# Patient Record
Sex: Female | Born: 1935 | Race: Black or African American | Hispanic: No | Smoking: Never smoker
Health system: Southern US, Community
[De-identification: ages and names within clinical notes are randomized; demographics above are authoritative.]

## PROBLEM LIST (undated history)

## (undated) DIAGNOSIS — N186 End stage renal disease: Secondary | ICD-10-CM

## (undated) DIAGNOSIS — D631 Anemia in chronic kidney disease: Secondary | ICD-10-CM

## (undated) DIAGNOSIS — I10 Essential (primary) hypertension: Secondary | ICD-10-CM

## (undated) DIAGNOSIS — I701 Atherosclerosis of renal artery: Secondary | ICD-10-CM

## (undated) DIAGNOSIS — E559 Vitamin D deficiency, unspecified: Secondary | ICD-10-CM

## (undated) DIAGNOSIS — M109 Gout, unspecified: Secondary | ICD-10-CM

## (undated) DIAGNOSIS — L97509 Non-pressure chronic ulcer of other part of unspecified foot with unspecified severity: Secondary | ICD-10-CM

## (undated) DIAGNOSIS — I639 Cerebral infarction, unspecified: Secondary | ICD-10-CM

## (undated) DIAGNOSIS — I6529 Occlusion and stenosis of unspecified carotid artery: Secondary | ICD-10-CM

## (undated) DIAGNOSIS — D509 Iron deficiency anemia, unspecified: Secondary | ICD-10-CM

## (undated) DIAGNOSIS — N189 Chronic kidney disease, unspecified: Secondary | ICD-10-CM

## (undated) DIAGNOSIS — E039 Hypothyroidism, unspecified: Secondary | ICD-10-CM

## (undated) DIAGNOSIS — I7090 Unspecified atherosclerosis: Secondary | ICD-10-CM

## (undated) DIAGNOSIS — I509 Heart failure, unspecified: Secondary | ICD-10-CM

## (undated) HISTORY — PX: ABDOMINAL HYSTERECTOMY: SHX81

## (undated) HISTORY — PX: DIALYSIS FISTULA CREATION: SHX611

---

## 2003-11-05 ENCOUNTER — Other Ambulatory Visit: Payer: Self-pay

## 2004-12-12 ENCOUNTER — Inpatient Hospital Stay: Payer: Self-pay | Admitting: Internal Medicine

## 2004-12-12 ENCOUNTER — Other Ambulatory Visit: Payer: Self-pay

## 2008-08-27 ENCOUNTER — Inpatient Hospital Stay: Payer: Self-pay | Admitting: Internal Medicine

## 2010-03-22 ENCOUNTER — Inpatient Hospital Stay: Payer: Self-pay | Admitting: Internal Medicine

## 2010-04-04 ENCOUNTER — Encounter: Payer: Self-pay | Admitting: Family Medicine

## 2010-04-12 ENCOUNTER — Encounter: Payer: Self-pay | Admitting: Family Medicine

## 2010-05-11 ENCOUNTER — Encounter: Payer: Self-pay | Admitting: Family Medicine

## 2010-10-14 ENCOUNTER — Emergency Department: Payer: Self-pay | Admitting: Emergency Medicine

## 2011-01-02 ENCOUNTER — Ambulatory Visit: Payer: Self-pay | Admitting: Vascular Surgery

## 2011-09-12 ENCOUNTER — Ambulatory Visit: Payer: Self-pay | Admitting: Vascular Surgery

## 2011-09-12 LAB — CBC
HCT: 36 % (ref 35.0–47.0)
HGB: 11.6 g/dL — ABNORMAL LOW (ref 12.0–16.0)
MCH: 25.9 pg — ABNORMAL LOW (ref 26.0–34.0)
MCHC: 32.1 g/dL (ref 32.0–36.0)
MCV: 81 fL (ref 80–100)
RBC: 4.47 10*6/uL (ref 3.80–5.20)
RDW: 13.9 % (ref 11.5–14.5)

## 2011-09-12 LAB — BASIC METABOLIC PANEL
Chloride: 109 mmol/L — ABNORMAL HIGH (ref 98–107)
Co2: 21 mmol/L (ref 21–32)
Creatinine: 2.77 mg/dL — ABNORMAL HIGH (ref 0.60–1.30)
Potassium: 4.4 mmol/L (ref 3.5–5.1)
Sodium: 139 mmol/L (ref 136–145)

## 2011-09-19 ENCOUNTER — Ambulatory Visit: Payer: Self-pay | Admitting: Vascular Surgery

## 2012-05-03 ENCOUNTER — Inpatient Hospital Stay: Payer: Self-pay | Admitting: Internal Medicine

## 2012-05-03 LAB — COMPREHENSIVE METABOLIC PANEL
Albumin: 3.8 g/dL (ref 3.4–5.0)
Bilirubin,Total: 0.8 mg/dL (ref 0.2–1.0)
Calcium, Total: 9.2 mg/dL (ref 8.5–10.1)
Co2: 20 mmol/L — ABNORMAL LOW (ref 21–32)
EGFR (Non-African Amer.): 17 — ABNORMAL LOW
Glucose: 94 mg/dL (ref 65–99)
Osmolality: 285 (ref 275–301)
Potassium: 4.3 mmol/L (ref 3.5–5.1)
SGOT(AST): 18 U/L (ref 15–37)
SGPT (ALT): 14 U/L (ref 12–78)
Total Protein: 8.9 g/dL — ABNORMAL HIGH (ref 6.4–8.2)

## 2012-05-03 LAB — CK TOTAL AND CKMB (NOT AT ARMC)
CK-MB: 0.9 ng/mL (ref 0.5–3.6)
CK-MB: 0.9 ng/mL (ref 0.5–3.6)

## 2012-05-03 LAB — CBC WITH DIFFERENTIAL/PLATELET
Basophil #: 0.1 10*3/uL (ref 0.0–0.1)
Basophil %: 1.1 %
Eosinophil #: 0.3 10*3/uL (ref 0.0–0.7)
Eosinophil %: 3.7 %
HCT: 41.9 % (ref 35.0–47.0)
MCH: 24.2 pg — ABNORMAL LOW (ref 26.0–34.0)
MCHC: 32.1 g/dL (ref 32.0–36.0)
Monocyte #: 0.7 x10 3/mm (ref 0.2–0.9)
Monocyte %: 7.9 %
Neutrophil #: 5.4 10*3/uL (ref 1.4–6.5)
Neutrophil %: 64 %
RBC: 5.54 10*6/uL — ABNORMAL HIGH (ref 3.80–5.20)
RDW: 17.6 % — ABNORMAL HIGH (ref 11.5–14.5)

## 2012-05-03 LAB — TROPONIN I: Troponin-I: 0.02 ng/mL

## 2012-05-03 LAB — TSH: Thyroid Stimulating Horm: 2.49 u[IU]/mL

## 2012-05-04 LAB — CBC WITH DIFFERENTIAL/PLATELET
Basophil %: 1.3 %
Lymphocyte #: 1.3 10*3/uL (ref 1.0–3.6)
Lymphocyte %: 20.8 %
MCHC: 32.5 g/dL (ref 32.0–36.0)
MCV: 75 fL — ABNORMAL LOW (ref 80–100)
Monocyte #: 0.8 x10 3/mm (ref 0.2–0.9)
Monocyte %: 13.1 %
Neutrophil %: 60.6 %
RBC: 4.83 10*6/uL (ref 3.80–5.20)
RDW: 17.1 % — ABNORMAL HIGH (ref 11.5–14.5)

## 2012-05-04 LAB — BASIC METABOLIC PANEL
Calcium, Total: 8.8 mg/dL (ref 8.5–10.1)
Chloride: 112 mmol/L — ABNORMAL HIGH (ref 98–107)
Creatinine: 2.7 mg/dL — ABNORMAL HIGH (ref 0.60–1.30)
EGFR (African American): 18 — ABNORMAL LOW
EGFR (Non-African Amer.): 15 — ABNORMAL LOW
Glucose: 86 mg/dL (ref 65–99)

## 2012-05-04 LAB — CK TOTAL AND CKMB (NOT AT ARMC)
CK, Total: 34 U/L (ref 21–215)
CK-MB: 0.7 ng/mL (ref 0.5–3.6)

## 2012-05-04 LAB — LIPID PANEL
Cholesterol: 127 mg/dL (ref 0–200)
VLDL Cholesterol, Calc: 13 mg/dL (ref 5–40)

## 2012-05-05 LAB — BASIC METABOLIC PANEL
Anion Gap: 8 (ref 7–16)
Calcium, Total: 8.3 mg/dL — ABNORMAL LOW (ref 8.5–10.1)
Creatinine: 3.48 mg/dL — ABNORMAL HIGH (ref 0.60–1.30)
EGFR (African American): 13 — ABNORMAL LOW
EGFR (Non-African Amer.): 11 — ABNORMAL LOW
Potassium: 4.1 mmol/L (ref 3.5–5.1)
Sodium: 140 mmol/L (ref 136–145)

## 2012-05-05 LAB — URINALYSIS, COMPLETE
Bacteria: NONE SEEN
Bilirubin,UR: NEGATIVE
Glucose,UR: NEGATIVE mg/dL (ref 0–75)
Leukocyte Esterase: NEGATIVE
Nitrite: NEGATIVE
Ph: 5 (ref 4.5–8.0)
Protein: 30
Specific Gravity: 1.01 (ref 1.003–1.030)
Squamous Epithelial: 1
WBC UR: 1 /HPF (ref 0–5)

## 2012-05-05 LAB — TROPONIN I
Troponin-I: 0.04 ng/mL
Troponin-I: 0.03 ng/mL
Troponin-I: 0.03 ng/mL

## 2012-05-05 LAB — PHOSPHORUS: Phosphorus: 4 mg/dL (ref 2.5–4.9)

## 2012-05-06 LAB — BASIC METABOLIC PANEL
Co2: 22 mmol/L (ref 21–32)
EGFR (African American): 13 — ABNORMAL LOW
EGFR (Non-African Amer.): 11 — ABNORMAL LOW
Glucose: 99 mg/dL (ref 65–99)
Osmolality: 292 (ref 275–301)
Potassium: 3.9 mmol/L (ref 3.5–5.1)
Sodium: 140 mmol/L (ref 136–145)

## 2012-05-06 LAB — RENAL FUNCTION PANEL
Albumin: 2.9 g/dL — ABNORMAL LOW (ref 3.4–5.0)
Anion Gap: 9 (ref 7–16)
BUN: 47 mg/dL — ABNORMAL HIGH (ref 7–18)
Chloride: 106 mmol/L (ref 98–107)
Co2: 24 mmol/L (ref 21–32)
EGFR (African American): 11 — ABNORMAL LOW
Glucose: 78 mg/dL (ref 65–99)
Osmolality: 289 (ref 275–301)
Phosphorus: 4 mg/dL (ref 2.5–4.9)
Potassium: 4.1 mmol/L (ref 3.5–5.1)

## 2012-05-06 LAB — HEMOGLOBIN: HGB: 11 g/dL — ABNORMAL LOW (ref 12.0–16.0)

## 2012-05-06 LAB — PLATELET COUNT: Platelet: 216 10*3/uL (ref 150–440)

## 2013-08-27 ENCOUNTER — Inpatient Hospital Stay: Payer: Self-pay | Admitting: Specialist

## 2013-08-27 LAB — BASIC METABOLIC PANEL
Anion Gap: 12 (ref 7–16)
BUN: 51 mg/dL — AB (ref 7–18)
Calcium, Total: 9 mg/dL (ref 8.5–10.1)
Chloride: 108 mmol/L — ABNORMAL HIGH (ref 98–107)
Co2: 19 mmol/L — ABNORMAL LOW (ref 21–32)
Creatinine: 3.23 mg/dL — ABNORMAL HIGH (ref 0.60–1.30)
EGFR (African American): 14 — ABNORMAL LOW
EGFR (Non-African Amer.): 12 — ABNORMAL LOW
GLUCOSE: 81 mg/dL (ref 65–99)
OSMOLALITY: 290 (ref 275–301)
Potassium: 4 mmol/L (ref 3.5–5.1)
SODIUM: 139 mmol/L (ref 136–145)

## 2013-08-27 LAB — TSH: Thyroid Stimulating Horm: 1.98 u[IU]/mL

## 2013-08-27 LAB — CK TOTAL AND CKMB (NOT AT ARMC)
CK, TOTAL: 117 U/L
CK, Total: 68 U/L
CK, Total: 34 U/L
CK-MB: 1.7 ng/mL (ref 0.5–3.6)
CK-MB: 2.3 ng/mL (ref 0.5–3.6)
CK-MB: 2.1 ng/mL (ref 0.5–3.6)

## 2013-08-27 LAB — CBC WITH DIFFERENTIAL/PLATELET
BASOS ABS: 0.1 10*3/uL (ref 0.0–0.1)
Basophil %: 1.3 %
Eosinophil #: 0.1 10*3/uL (ref 0.0–0.7)
Eosinophil %: 1.2 %
HCT: 34.6 % — AB (ref 35.0–47.0)
HGB: 11 g/dL — ABNORMAL LOW (ref 12.0–16.0)
LYMPHS PCT: 13.1 %
Lymphocyte #: 1 10*3/uL (ref 1.0–3.6)
MCH: 26 pg (ref 26.0–34.0)
MCHC: 31.9 g/dL — AB (ref 32.0–36.0)
MCV: 82 fL (ref 80–100)
MONO ABS: 1 x10 3/mm — AB (ref 0.2–0.9)
Monocyte %: 12.6 %
Neutrophil #: 5.7 10*3/uL (ref 1.4–6.5)
Neutrophil %: 71.8 %
Platelet: 151 10*3/uL (ref 150–440)
RBC: 4.24 10*6/uL (ref 3.80–5.20)
RDW: 15.9 % — ABNORMAL HIGH (ref 11.5–14.5)
WBC: 8 10*3/uL (ref 3.6–11.0)

## 2013-08-27 LAB — APTT
Activated PTT: 27.7 secs (ref 23.6–35.9)
Activated PTT: 100.7 secs — ABNORMAL HIGH (ref 23.6–35.9)

## 2013-08-27 LAB — MAGNESIUM: Magnesium: 2.1 mg/dL

## 2013-08-27 LAB — TROPONIN I
TROPONIN-I: 0.08 ng/mL — AB
Troponin-I: 0.12 ng/mL — ABNORMAL HIGH
Troponin-I: 0.14 ng/mL — ABNORMAL HIGH

## 2013-08-28 LAB — BASIC METABOLIC PANEL
ANION GAP: 10 (ref 7–16)
BUN: 53 mg/dL — ABNORMAL HIGH (ref 7–18)
CHLORIDE: 108 mmol/L — AB (ref 98–107)
CO2: 22 mmol/L (ref 21–32)
CREATININE: 3.37 mg/dL — AB (ref 0.60–1.30)
Calcium, Total: 8.6 mg/dL (ref 8.5–10.1)
EGFR (Non-African Amer.): 11 — ABNORMAL LOW
GFR CALC AF AMER: 13 — AB
GLUCOSE: 79 mg/dL (ref 65–99)
OSMOLALITY: 293 (ref 275–301)
Potassium: 3.7 mmol/L (ref 3.5–5.1)
Sodium: 140 mmol/L (ref 136–145)

## 2013-08-28 LAB — CBC WITH DIFFERENTIAL/PLATELET
Basophil #: 0.1 10*3/uL (ref 0.0–0.1)
Basophil %: 1.7 %
EOS PCT: 3 %
Eosinophil #: 0.2 10*3/uL (ref 0.0–0.7)
HCT: 29.5 % — AB (ref 35.0–47.0)
HGB: 9.5 g/dL — AB (ref 12.0–16.0)
LYMPHS ABS: 1.4 10*3/uL (ref 1.0–3.6)
LYMPHS PCT: 19.3 %
MCH: 25.7 pg — AB (ref 26.0–34.0)
MCHC: 32.1 g/dL (ref 32.0–36.0)
MCV: 80 fL (ref 80–100)
MONO ABS: 1.2 x10 3/mm — AB (ref 0.2–0.9)
Monocyte %: 15.9 %
NEUTROS ABS: 4.4 10*3/uL (ref 1.4–6.5)
NEUTROS PCT: 60.1 %
PLATELETS: 148 10*3/uL — AB (ref 150–440)
RBC: 3.68 10*6/uL — AB (ref 3.80–5.20)
RDW: 15.8 % — AB (ref 11.5–14.5)
WBC: 7.3 10*3/uL (ref 3.6–11.0)

## 2013-08-28 LAB — URINALYSIS, COMPLETE
BILIRUBIN, UR: NEGATIVE
Blood: NEGATIVE
Glucose,UR: NEGATIVE mg/dL (ref 0–75)
Ketone: NEGATIVE
Leukocyte Esterase: NEGATIVE
Nitrite: NEGATIVE
Ph: 5 (ref 4.5–8.0)
Protein: NEGATIVE
RBC,UR: 6 /HPF (ref 0–5)
SPECIFIC GRAVITY: 1.005 (ref 1.003–1.030)
Squamous Epithelial: <1
WBC UR: 2 /HPF (ref 0–5)

## 2013-08-28 LAB — LIPID PANEL
CHOLESTEROL: 80 mg/dL (ref 0–200)
HDL Cholesterol: 26 mg/dL — ABNORMAL LOW (ref 40–60)
Ldl Cholesterol, Calc: 46 mg/dL (ref 0–100)
Triglycerides: 39 mg/dL (ref 0–200)
VLDL Cholesterol, Calc: 8 mg/dL (ref 5–40)

## 2013-08-28 LAB — APTT: Activated PTT: 105.1 secs — ABNORMAL HIGH (ref 23.6–35.9)

## 2013-08-29 LAB — APTT: Activated PTT: 135.8 secs — ABNORMAL HIGH (ref 23.6–35.9)

## 2013-11-25 ENCOUNTER — Inpatient Hospital Stay: Payer: Self-pay | Admitting: Internal Medicine

## 2013-11-25 LAB — TROPONIN I
TROPONIN-I: 0.54 ng/mL — AB
TROPONIN-I: 0.68 ng/mL — AB
Troponin-I: 0.5 ng/mL — ABNORMAL HIGH

## 2013-11-25 LAB — CK-MB
CK-MB: 2.8 ng/mL (ref 0.5–3.6)
CK-MB: 3.3 ng/mL (ref 0.5–3.6)

## 2013-11-25 LAB — COMPREHENSIVE METABOLIC PANEL
ANION GAP: 11 (ref 7–16)
AST: 32 U/L (ref 15–37)
Albumin: 3 g/dL — ABNORMAL LOW (ref 3.4–5.0)
Alkaline Phosphatase: 104 U/L
BILIRUBIN TOTAL: 1.9 mg/dL — AB (ref 0.2–1.0)
BUN: 52 mg/dL — AB (ref 7–18)
CHLORIDE: 108 mmol/L — AB (ref 98–107)
CO2: 18 mmol/L — AB (ref 21–32)
CREATININE: 3.24 mg/dL — AB (ref 0.60–1.30)
Calcium, Total: 8.6 mg/dL (ref 8.5–10.1)
EGFR (Non-African Amer.): 12 — ABNORMAL LOW
GFR CALC AF AMER: 14 — AB
GLUCOSE: 106 mg/dL — AB (ref 65–99)
Osmolality: 288 (ref 275–301)
Potassium: 4.9 mmol/L (ref 3.5–5.1)
SGPT (ALT): 17 U/L
Sodium: 137 mmol/L (ref 136–145)
Total Protein: 7.6 g/dL (ref 6.4–8.2)

## 2013-11-25 LAB — URINALYSIS, COMPLETE
Bilirubin,UR: NEGATIVE
Blood: NEGATIVE
GLUCOSE, UR: NEGATIVE mg/dL (ref 0–75)
Hyaline Cast: 6
Ketone: NEGATIVE
Nitrite: NEGATIVE
Ph: 5 (ref 4.5–8.0)
Protein: 30
Specific Gravity: 1.01 (ref 1.003–1.030)
Squamous Epithelial: 5
WBC UR: 11 /HPF (ref 0–5)

## 2013-11-25 LAB — CBC
HCT: 35.9 % (ref 35.0–47.0)
HGB: 10.9 g/dL — ABNORMAL LOW (ref 12.0–16.0)
MCH: 21.9 pg — ABNORMAL LOW (ref 26.0–34.0)
MCHC: 30.5 g/dL — AB (ref 32.0–36.0)
MCV: 72 fL — AB (ref 80–100)
PLATELETS: 163 10*3/uL (ref 150–440)
RBC: 4.99 10*6/uL (ref 3.80–5.20)
RDW: 22.9 % — ABNORMAL HIGH (ref 11.5–14.5)
WBC: 6.4 10*3/uL (ref 3.6–11.0)

## 2013-11-25 LAB — CK TOTAL AND CKMB (NOT AT ARMC)
CK, Total: 66 U/L
CK-MB: 2.5 ng/mL (ref 0.5–3.6)

## 2013-11-25 LAB — PRO B NATRIURETIC PEPTIDE: B-Type Natriuretic Peptide: 12601 pg/mL — ABNORMAL HIGH (ref 0–450)

## 2013-11-26 LAB — CBC WITH DIFFERENTIAL/PLATELET
BASOS PCT: 1.4 %
Basophil #: 0.1 10*3/uL (ref 0.0–0.1)
Eosinophil #: 0.2 10*3/uL (ref 0.0–0.7)
Eosinophil %: 2.7 %
HCT: 33.8 % — ABNORMAL LOW (ref 35.0–47.0)
HGB: 10.6 g/dL — AB (ref 12.0–16.0)
LYMPHS ABS: 1.1 10*3/uL (ref 1.0–3.6)
LYMPHS PCT: 17.4 %
MCH: 22.2 pg — AB (ref 26.0–34.0)
MCHC: 31.3 g/dL — AB (ref 32.0–36.0)
MCV: 71 fL — ABNORMAL LOW (ref 80–100)
Monocyte #: 1.1 x10 3/mm — ABNORMAL HIGH (ref 0.2–0.9)
Monocyte %: 16.3 %
NEUTROS ABS: 4.1 10*3/uL (ref 1.4–6.5)
NEUTROS PCT: 62.2 %
Platelet: 98 10*3/uL — ABNORMAL LOW (ref 150–440)
RBC: 4.75 10*6/uL (ref 3.80–5.20)
RDW: 22.7 % — AB (ref 11.5–14.5)
WBC: 6.6 10*3/uL (ref 3.6–11.0)

## 2013-11-26 LAB — BASIC METABOLIC PANEL
Anion Gap: 13 (ref 7–16)
BUN: 47 mg/dL — ABNORMAL HIGH (ref 7–18)
CALCIUM: 8.8 mg/dL (ref 8.5–10.1)
Chloride: 106 mmol/L (ref 98–107)
Co2: 21 mmol/L (ref 21–32)
Creatinine: 3.27 mg/dL — ABNORMAL HIGH (ref 0.60–1.30)
EGFR (Non-African Amer.): 12 — ABNORMAL LOW
GFR CALC AF AMER: 14 — AB
GLUCOSE: 73 mg/dL (ref 65–99)
OSMOLALITY: 290 (ref 275–301)
Potassium: 4.1 mmol/L (ref 3.5–5.1)
Sodium: 140 mmol/L (ref 136–145)

## 2013-11-26 LAB — MAGNESIUM: Magnesium: 2 mg/dL

## 2013-11-27 LAB — CBC WITH DIFFERENTIAL/PLATELET
BASOS PCT: 3.2 %
Basophil #: 0.2 10*3/uL — ABNORMAL HIGH (ref 0.0–0.1)
EOS ABS: 0.2 10*3/uL (ref 0.0–0.7)
EOS PCT: 2.8 %
HCT: 31.4 % — AB (ref 35.0–47.0)
HGB: 9.8 g/dL — AB (ref 12.0–16.0)
LYMPHS ABS: 0.6 10*3/uL — AB (ref 1.0–3.6)
LYMPHS PCT: 9.3 %
MCH: 22.1 pg — ABNORMAL LOW (ref 26.0–34.0)
MCHC: 31 g/dL — AB (ref 32.0–36.0)
MCV: 71 fL — ABNORMAL LOW (ref 80–100)
Monocyte #: 0.9 x10 3/mm (ref 0.2–0.9)
Monocyte %: 14.5 %
NEUTROS ABS: 4.3 10*3/uL (ref 1.4–6.5)
NEUTROS PCT: 70.2 %
PLATELETS: 58 10*3/uL — AB (ref 150–440)
RBC: 4.41 10*6/uL (ref 3.80–5.20)
RDW: 23.3 % — ABNORMAL HIGH (ref 11.5–14.5)
WBC: 6.2 10*3/uL (ref 3.6–11.0)

## 2013-11-28 LAB — BASIC METABOLIC PANEL
Anion Gap: 12 (ref 7–16)
BUN: 40 mg/dL — AB (ref 7–18)
Calcium, Total: 8.2 mg/dL — ABNORMAL LOW (ref 8.5–10.1)
Chloride: 104 mmol/L (ref 98–107)
Co2: 24 mmol/L (ref 21–32)
Creatinine: 3.04 mg/dL — ABNORMAL HIGH (ref 0.60–1.30)
GFR CALC AF AMER: 15 — AB
GFR CALC NON AF AMER: 13 — AB
Glucose: 80 mg/dL (ref 65–99)
OSMOLALITY: 288 (ref 275–301)
Potassium: 4.2 mmol/L (ref 3.5–5.1)
Sodium: 140 mmol/L (ref 136–145)

## 2013-11-28 LAB — CBC WITH DIFFERENTIAL/PLATELET
BASOS ABS: 0.1 10*3/uL (ref 0.0–0.1)
BASOS PCT: 1.4 %
EOS ABS: 0.2 10*3/uL (ref 0.0–0.7)
Eosinophil %: 2.6 %
HCT: 31.8 % — AB (ref 35.0–47.0)
HGB: 10 g/dL — ABNORMAL LOW (ref 12.0–16.0)
LYMPHS ABS: 1 10*3/uL (ref 1.0–3.6)
LYMPHS PCT: 16.4 %
MCH: 22.4 pg — ABNORMAL LOW (ref 26.0–34.0)
MCHC: 31.3 g/dL — AB (ref 32.0–36.0)
MCV: 72 fL — ABNORMAL LOW (ref 80–100)
Monocyte #: 1.1 x10 3/mm — ABNORMAL HIGH (ref 0.2–0.9)
Monocyte %: 18.9 %
NEUTROS ABS: 3.5 10*3/uL (ref 1.4–6.5)
Neutrophil %: 60.7 %
Platelet: 66 10*3/uL — ABNORMAL LOW (ref 150–440)
RBC: 4.45 10*6/uL (ref 3.80–5.20)
RDW: 23 % — AB (ref 11.5–14.5)
WBC: 5.8 10*3/uL (ref 3.6–11.0)

## 2013-11-28 LAB — PHOSPHORUS: Phosphorus: 2.4 mg/dL — ABNORMAL LOW (ref 2.5–4.9)

## 2013-11-28 LAB — MAGNESIUM: Magnesium: 1.9 mg/dL

## 2014-03-06 ENCOUNTER — Emergency Department: Payer: Self-pay | Admitting: Emergency Medicine

## 2014-05-04 ENCOUNTER — Ambulatory Visit: Payer: Self-pay | Admitting: Vascular Surgery

## 2014-07-02 NOTE — Consult Note (Signed)
PATIENT NAME:  Yvonne Roy, POULLARD MR#:  [id number] DATE OF BIRTH:  04-28-1922  DATE OF CONSULTATION:  05/04/2012  REFERRING PHYSICIAN:  Shreyang H. Allena Katz, MD CONSULTING PHYSICIAN:  Norwood Quezada D. Juliann Pares, MD  PRIMARY CARE PHYSICIAN:  [facility].    INDICATION: Shortness of breath, elevated troponin, weakness and fatigue.   HISTORY OF PRESENT ILLNESS: The patient is an 81 year old African American female with a history of hyperlipidemia, hypertension, who has a history of peripheral vascular disease, chronic renal insufficiency. She has had an upper extremity AV a shunt placed for dialysis, came to the Emergency Room because of complaints of left arm discomfort, jumping on its own and the patient  had some numbness. She states that she had an AV shunt with intermittent episodes of numbness in the past.  She also reports that she got short of breath. When she came to the Emergency Room, she was found to be slightly hypoxic. Chest x-ray was suggestive of congestive heart failure. The patient was admitted for further evaluation and care. She is having some dyspnea on exertion, some leg swelling. She has been using 2 pillows at night, vague chest discomfort. No syncope. No weakness or fatigue.   REVIEW OF SYSTEMS:  No blackout spells or syncope. No nausea, vomiting. No fever, no chills, no sweats, no weight loss or no weight gain. No hemoptysis, no hematemesis. No bright red blood per rectum.   PAST MEDICAL HISTORY: Chronic renal insufficiency, hypertension, diabetes, peripheral vascular disease, coronary artery disease.   PAST SURGICAL HISTORY:  Carotid endarterectomy, cardiac catheterization, left arm fistula, history of cerebrovascular accident.   SOCIAL HISTORY: No smoking or alcohol consumption. Lives with her daughter, retired.   FAMILY HISTORY: Hypertension, coronary artery disease and hypercholesterolemia.   ALLERGIES: CLONIDINE CAUSES BRADYCARDIA.   MEDICATIONS:  Amlodipine 10 mg a day, aspirin  81 mg a day, carvedilol 25 mg twice a day, > 10 mg a day, Plavix 75 mg a day, Colcrys 0.6 mg daily, Lasix 40 mg a day, hydralazine 50 mg 4 times a day, Imdur 30 mg a day, multivitamin once a day,  Renvela 800 mg t.i.d., sodium bicarbonate 650 twice a day, Synthroid 100 mcg daily.   PHYSICAL EXAMINATION: VITAL SIGNS:  Blood pressure160/80, pulse 75, respiratory rate 20, afebrile.  HEENT: Normocephalic, atraumatic. Pupils equal and reactive to light.  NECK: Supple. No significant jugular venous distention or adenopathy.  LUNGS: Bilateral crackles in both lungs. Mild rhonchi, adequate air movement.  HEART: Regular rate and rhythm. Systolic ejection murmur left sternal border, 2/6. Positive S4.  ABDOMEN: Benign EXTREMITIES:  1 to 2+ edema.  NEUROLOGIC: Intact.  SKIN: Normal.   LABORATORY, DIAGNOSTIC, AND RADIOLOGICAL DATA: BNP 21,000 glucose 94, BUN 24, creatinine 2.51, sodium 141, potassium 4.3, chloride 111, CO2 20, calcium 9.2. LFTs are negative. CPK 35. MB 0.9. Troponin 0.2. White count 8.5, hemoglobin 13, platelet count 287.   Chest x-ray shows mild congestive heart failure.   ASSESSMENT: 1.  Heart failure.  2.  Peripheral vascular disease. 3.  Murmur.  4.  Chronic renal insufficiency. 5.  Left arm fistula.  6.  Hypothyroidism.  7.  Edema. 8.  History of cerebrovascular accident in the past. 9.  Elevated BNP.   PLAN: I agree with admit. Rule out for myocardial infarction. Follow-up cardiac enzymes, follow up troponins, follow up EKG. Recommend further evaluation of heart failure. Would recommend echocardiogram, continue diuresis. Continue blood pressure control. Follow-up lipid management. Diabetes management unnecessary. Continue aggressive therapy for heart failure and  left arm numbness.  Continue treatment for gout. Shortness of breath may be related to heart failure. We will evaluate for cardiomyopathy as well. Try to diurese as necessary for heart failure which showed up o  chest  x-ray. We will treat the patient medically for now.      ____________________________ [name] D. [name], MD ddc:cc D: 05/04/2012 20:40:21 ET T: 05/04/2012 21:39:05 ET JOB#: [id number]  cc: Hollie Wojahn D. [name], MD, <Dictator> Alwyn PeaWAYNE D Jametta Moorehead MD ELECTRONICALLY SIGNED 05/22/2012 10:51

## 2014-07-02 NOTE — H&P (Signed)
DATE OF BIRTH:  04/03/1922  DATE OF ADMISSION:  05/03/2012  PRIMARY CARE PROVIDER:   [facility]   EMERGENCY DEPARTMENT REFERRING DOCTOR:   Dr. Cyril Loosen   REASON FOR ADMISSION: Shortness of breath.   HISTORY OF PRESENT ILLNESS: The patient is an 82 year old African-American female with history of hyperlipidemia, hypertension, who has a history of peripheral vascular disease, who has chronic renal failure, has had an upper extremity AV graft for future dialysis, who came to the ED with main complaint of having her left arm, she stated, that was jumping up on its own. It also had some numbness. The patient reports that since she has had the AV graft, she has had intermittent episodes of the arm going numb. She also reported that she has been short of breath. In the ED, patient was noted to be hypoxic and was noted to have a chest x-ray  suggestive of congestive heart failure, so I was asked to admit the patient with congestive heart failure. The patient reports that she has been having dyspnea on exertion. Also has noticed swelling in the lower extremity, but she only uses one pillow at nighttime. In terms of the left arm symptoms, she no longer has any weakness in the arm. She reports that she was awake when this happened, so there was no seizure-type activity. She reports that it was just for a minute she was unable to control the arm, and it was moving up and down. She otherwise denies any chest pains, palpitations. Denies any fevers or chills. No cough. Denies any abdominal pain, nausea, vomiting or diarrhea. Denies any urinary frequency, urgency or hesitancy.   PAST MEDICAL HISTORY: 1.  History of chronic renal failure, which is progressive.  2.  Hypertension.  3.  Diabetes.  4.  Peripheral vascular disease.  5.  History of carotid endarterectomy on the left. 6.  History of coronary artery disease. She previously had a cardiac catheterization done. According to records, she has a cath; however,  patient reports that there was no stent placed. According to an echocardiogram report that showed that she had 2-vessel significant disease, it fails to quantify how much the obstruction was.  7.  Status post left arm AV fistula placement.  8.  History of CVA, according to the patient.   9.  Hyperlipidemia.  10.  Hypothyroidism.   HOME MEDICATIONS:  Amlodipine 10 daily. Aspirin 81, 1 tab p.o. daily. Carvedilol 25, 1 tab p.o. b.i.d. Cetirizine 10, 1 tab p.o. daily. Plavix 75 p.o. daily. Colcrys 0.6 daily. Lasix 40, 1 tab p.o. b.i.d. Hydralazine 50, 1 tab 4 times per day. Isosorbide mononitrate 30 daily.  Multivitamin daily. Renvela carbonate 800 mg 1 tab p.o. t.i.d. Sodium bicarb 650, 1 tab p.o. b.i.d. Synthroid 100 mcg daily.   ALLERGIES:  She is allergic to CLONIDINE, which causes severe bradycardia.   FAMILY HISTORY:  Positive for hypertension, coronary artery disease and high cholesterol.   SOCIAL HISTORY:  Does not smoke, does not drink. Lives with her daughter.   REVIEW OF SYSTEMS:   GENERAL:  Denies any fevers. Complains of some mild weakness. No weight  loss, no weight gain.  EYES: No blurred or double vision. No redness. No inflammation.  EARS, NOSE, THROAT:  No tinnitus. No ear pain. No difficulty swallowing.  RESPIRATORY:  Denies any cough, wheezing, hemoptysis. No COPD, no TB.  CARDIOVASCULAR: Denies any chest pain. Complains of  dyspnea on exertion. Complains of edema. Denies any arrhythmia.  GASTROINTESTINAL:  No nausea,  vomiting, diarrhea. No abdominal pain. No hematemesis. No melena. No changes in bowel habits.  GENITOURINARY:  Denies any dysuria, hematuria, renal colic or frequency.  ENDOCRINE:  Denies any polyuria, nocturia or thyroid problems.  HEMATOLOGIC AND LYMPHATIC:  Denies any major bruisability or bleeding.  SKIN:  No acne. No rash. No changes in mole, hair or skin.  MUSCULOSKELETAL:  Denies any pain in neck, back, or shoulder.  NEUROLOGIC:  No numbness. No CVA,  no TIA.  PSYCHIATRIC:  No anxiety. No insomnia. No ADD.   PHYSICAL EXAMINATION: VITAL SIGNS:  Temperature 97.8, pulse 78, respirations 20, blood pressure 160/81, O2 is 93%.  GENERAL: The patient is an elderly African-American female. She is mildly  tachypneic, with mild accessory muscle usage.    HEENT: Pupils equal, round, reactive to light and accommodation. There is no conjunctival pallor. No scleral icterus. Nasal exam shows no drainage or ulceration. Oropharynx is clear, without any exudate.  NECK:  No thyromegaly. No carotid bruits.  CARDIOVASCULAR:  She has a systolic murmur at the left sternal border. No murmurs, rubs, clicks or gallops.  LUNGS:  Bilateral crackles throughout both lungs.  ABDOMEN:  Soft, nontender, nondistended. Positive bowel sounds x 4.  EXTREMITIES:  She has 1+ edema.  SKIN:  No rash.  LYMPHATICS:  No lymph nodes palpable.  VASCULAR:  Good DP, PT pulses.  PSYCHIATRIC:  Not anxious or depressed.  NEUROLOGIC: Awake, alert, oriented x 3. No focal deficits.   EVALUATIONS IN THE EMERGENCY DEPARTMENT:  BNP of 21,370. Glucose 94, BUN 24, creatinine 2.51, sodium 141, potassium 4.3, chloride 111, CO2 is 20, calcium 9.2. LFTs: Total protein 8.9, the rest of the LFTs are normal. CPK 35, CKMB 0.9. Troponin 0.02. WBC 8.5, hemoglobin 13.4, platelet count 287. Chest x-ray shows findings consistent with bilateral infiltrates and CHF. .   ASSESSMENT AND PLAN: The patient is an 81 year old African-American female with history of progressive renal failure, who presents with abnormal movement of the arm lasting a minute. In the Emergency Department, was noted to have dyspnea. Findings consistent with acute congestive heart failure.   1.  Acute congestive heart failure, type unknown. At this time, will treat her with IV Lasix. Will get an echocardiogram of the heart. The patient has murmur on examination, possible aortic stenosis. Will get an echocardiogram, get a Cardiology  evaluation.   2.  Chronic renal failure, stage IV. With diuresis, her renal function may worsen. Will ask Nephrology to evaluate the patient.   3.  Abnormal movement of the left arm. Unlikely seizure. Will check a CT scan of the head because of the numbness, with history of cerebrovascular accident in the past. At this time, will place her on aspirin.     4.  Hypothyroidism: Will continue Synthroid, check a TSH.   5.  CODE STATUS: I discussed with the patient. She wishes to be DO NOT RESUSCITATE. No cardiopulmonary resuscitation. No intubation. Marland Kitchen.   NOTE:  45 minutes spent.    ____________________________ Lacie ScottsShreyang H. Allena KatzPatel, MD shp:mr D: 05/03/2012 18:28:18 ET T: 05/03/2012 22:59:57 ET JOB#: [id number]  cc: Joscelin Fray H. Allena KatzPatel, MD, <Dictator> Charise CarwinSHREYANG H Carra Brindley MD ELECTRONICALLY SIGNED 05/05/2012 12:29

## 2014-07-02 NOTE — Discharge Summary (Signed)
PATIENT NAME:  Yvonne Roy, BERRES MR#:  [id number] DATE OF BIRTH:  1922/07/27  DATE OF ADMISSION:  05/03/2012 DATE OF DISCHARGE:  05/06/2012  ADMITTING DIAGNOSES:  1.  Acute congestive heart failure.  2.  Chronic kidney disease stage IV.  3.  Abnormal movement of left arm.   DISCHARGE DIAGNOSES: 1.  Suspected transient ischemic attack with left upper extremity numbness as well as weakness, resolved.  2.  Acute on chronic renal failure with diuresis with residual creatinine on 05/06/2012 of 3.47 and estimated GFR of 13 for African American lady.  3.  Congestive heart failure, left heart, acute on chronic, diastolic.  Echocardiogram this admission showing normal ejection fraction of 55%, mild mitral regurgitation as well as moderate tricuspid regurgitation and elevated right ventricular pressures.  4.  Hypertension.  5.  Heart murmur.  6.  Atrial fibrillation, back in sinus rhythm now.  7.  History of hyperthyroidism. 8.  Chronic kidney disease. 9.  Diabetes mellitus.  10.  Peripheral vascular disease.  11.  Left CEA. 12.  Coronary artery disease.  13.  Hyperlipidemia.  14.  Left AV fistula placement.   The patient was advised to weigh herself every day and call physician if she gains more than 2 pounds in one day or more than 5 pounds in one week.   DISCHARGE CONDITION:  Fair.   DISCHARGE MEDICATIONS:  The patient is to resume her outpatient medications which are:  1.  Renvela carbonite 800 mg by mouth 3 times daily.  2.  Synthroid 100 mcg by mouth daily.  3.  Multivitamins 1 daily.  4.  Clopidogrel 75 mg by mouth daily.  5.  Furosemide 40 mg by mouth twice daily.  6.  Isosorbide mononitrate 30 mg by mouth daily.  7.  Sodium bicarbonate 650 mg by mouth twice daily.  8.  Cetirizine 10 mg by mouth daily.  9.  Aspirin 81 mg by mouth daily.  10.  Carvedilol 12.5 mg by mouth twice daily.  11.  Amlodipine 5 mg by mouth daily.  12.  Hydralazine 25 mg by mouth 4 times daily.   Blood  pressure medications are being decreased in doses.  DIET:  2 gram salt, low fat, low cholesterol, carbohydrate-controlled diet, renal diet, regular consistency.   ACTIVITY LIMITATIONS:  As tolerated.   REFERRAL:  To home health, physical therapy as well as R.N.    FOLLOW-UP APPOINTMENT:  With Bobbye Riggs, RN in two days after discharge as well as Dr. Thedore Mins two days after discharge.   CONSULTANTS:  Nephrologist, Dr. Mosetta Pigeon, cardiologist, Dr. Juliann Pares, care management.   RADIOLOGIC STUDIES:  Chest x-ray, PA and lateral, 05/03/2012, showed findings consistent with congestive heart failure with pulmonary interstitial and alveolar edema.  Also, pleural effusions were noted secondary to pneumonia, could be obscured by densities present according to radiologist.  The patient's CT scan of head without contrast done on 05/03/2012 revealed no acute intracranial abnormality.  There were noted to be age-related changes present, and there is stable small lacunar infarction in the basilar ganglia.  Carotid ultrasound done on 05/04/2012, was significant for possible plaques noted in both carotid systems, no evidence of significant carotid stenosis however noted.  No dramatic change since January 2012.  Ultrasound of kidneys, bilateral, 05/04/2012, revealed kidneys which were small.  The cortical echo texture was increased consistent with medical renal disease.  No evidence of hydronephrosis was noted.  Nonsuspicious solid masses were demonstrated.  There were cysts present in both kidneys.  Echocardiogram 05/04/2012 revealed left ventricle grossly normal size.  No thrombus.  Left ventricular systolic function normal.  Ejection fraction equal or more than 55%, normal left ventricular wall thickness noted.  Right ventricle was moderately dilated.  Right ventricular systolic function was mildly reduced.  The left ventricular systolic pressure was elevated at 40 to 50 mmHg.  Small pericardial effusion was noted.     HOSPITAL COURSE:  The patient is an 81 year old African American female with past medical history significant for history of CKD who presented to the hospital on 05/03/2012 with complaints of shortness of breath.  Please refer to Dr. Eliane DecreePatel's admission note on 05/03/2012.  Apparently, patient came in because of left arm numbness as well as weakness.  She also reported her arm jumping and she was not sure if she was able to change that voluntarily.  She was noted to be short of breath and was admitted for congestive heart failure.  Her vitals on arrival to the hospital showed temperature 97.8, pulse was 78, respiratory rate was 20, blood pressure 160/81, saturation was 93% on oxygen therapy.  Physical exam revealed 1+ lower extremity edema and a systolic murmur on the left sternal border on exam.  The patient's lab data done on the day of admission 05/03/2012, revealed markedly elevated beta type natriuretic peptide of 21,370.  The patient's BUN and creatinine were 24 and 2.51, bicarbonate level was low at 20.  Estimated GFR for African American would be 19, and non-African American would be 17.  The patient's liver enzymes revealed total protein elevated to 8.9.  Cardiac enzymes x 3 were within normal limits.  TSH was normal at 2.49.  CBC, white blood cell count 8.5, hemoglobin was 13.4 and platelet count was 287 with a low MCV of 76.  Absolute neutrophil count was normal at 5.4.  Urinalysis was unremarkable.  The patient's EKG showed normal sinus rhythm at 87 beats per minute, premature atrial complexes, left axis deviation, no acute ST-T changes were noted.  Chest x-ray was remarkable for CHF.  The patient was admitted to the hospital for further evaluation.  She was initiated on diuretic therapy due to congestive heart failure and shortness of breath and mild hypoxia.  With this therapy she improved significantly, however with diuretic therapy she was noted to have worsening of acute on chronic renal failure.   On 05/06/2012, she was noted to have creatinine of 3.88.  It was felt that patient's acute on chronic renal failure was very likely related to diuretics as well as hypotension which patient was noted with advanced blood pressure medications.  For this reason, patient's blood pressure medications were cut down and the patient was given low rate IV fluids, after which patient's kidney function somewhat improved to 3.47.  It was felt that patient is stable to be discharged home.  She is to follow up with Dr. Cherylann RatelLateef or Dr. Thedore MinsSingh as outpatient for management of her kidney disease.  Meanwhile, in regards to her acute on chronic diastolic CHF, the patient was feeling quite okay and her oxygenation remained stable.  On day of discharge, 05/06/2012, the patient's oxygen saturation was 93% to 94% on room air at rest.    In regards to left upper extremity weakness, initially the patient had some weakness on arrival to the hospital, however no voluntary or involuntary movements were noted whatsoever.  As mentioned above, it was concerning for possible stroke and the patient underwent further evaluation for possible stroke with carotid ultrasound as  well as echocardiogram.  The patient's symptoms however resolved in the next 24 hours after admission to the hospital.  It was felt that patient very likely had TIA, however we could not completely rule out possible seizure episode resulting in some weakness of left upper extremity such as Pott's paralysis.  We felt however that it was less likely as patient had no other symptoms and she did not have any loss of consciousness.  However, if she has any more recurrences, I feel that she would need to be worked up for seizure disorder as well.  Meanwhile, she is to continue aspirin as well as Plavix.  We checked patient's lipid panel while she was in the hospital and noted that her LDL was 87.  The patient's triglycerides were 64, HDL was 27.  We advised patient to continue low fat,  low cholesterol diet and follow up with her primary care physician for further recommendations.  We will also change patient's omeprazole to Protonix due to interaction with Plavix to decrease the risks of stroke.    Next, in regards to heart murmur, we did echocardiogram while she was in the hospital and the patient was noted to have mild MR as well as moderate TR.  We felt that patient's valvular insufficiency very likely is the source of her heart murmur.  The patient's echocardiogram revealed normal ejection fraction and elevated right ventricular pressures.  The patient is to continue diuretics.   Next, the patient had an episode of atrial fibrillation while in the hospital, however she was back in sinus rhythm by the day of discharge.  The patient is to continue beta-blockers such as carvedilol.  It is recommended to follow patient's blood pressure readings and make decisions about advancement according to original doses as soon as possible to control her heart rate.  By the day of discharge, patient's vital signs are otherwise stable and temperature was 97.8, pulse was 64, respiration rate was 20, blood pressure 111/59.  Saturation was 93% to 94% on room air at rest.    Next, for history of CKD, as mentioned above, the patient had an episode of acute on chronic renal failure due to diuresis as well as slight hypotension while she was in the hospital.  Now her kidney function seem to be somewhat improved on some IV fluid administration.  We decreased patient's blood pressure medications with hopes that her kidney function will improve further along and she is to have her kidney function rechecked in her primary care physician or nephrologist office in the next few days after discharge.  Neurologist to continue her usual outpatient medications.   For diabetes mellitus, patient is to continue low fat, low cholesterol, as well as diabetic diet.  The patient needs to be considered for outpatient  cholesterol medications.  We did not initiate any cholesterol medications at this time yet due to the unclear reasons.    For history of peripheral vascular disease, as mentioned above rechecked patient's carotid ultrasound and we do not see any hemodynamically significant stenosis.  The patient is being discharged in stable condition with above-mentioned medications and follow-up.   TIME SPENT:  40 minutes.     ____________________________ Katharina Caper, MD rv:ea D: 05/06/2012 18:38:48 ET T: 05/06/2012 23:01:49 ET JOB#: [id number]  cc: Katharina Caper, MD, <Dictator> Bobbye Riggs, RN     Naleigha Raimondi Winona Legato MD ELECTRONICALLY SIGNED 06/03/2012 15:12

## 2014-07-03 NOTE — Discharge Summary (Signed)
PATIENT NAME:  Yvonne Roy, Yvonne Roy MR#:  [id number] DATE OF BIRTH:  27-Oct-1922  DATE OF ADMISSION:  08/27/2013 DATE OF DISCHARGE:  08/29/2013  For a detailed note, please take a look at the history and physical done on admission by Dr. Adrian SaranSital Mody.   DIAGNOSES AT DISCHARGE: As follows: Elevated troponin, likely in the setting of poor renal clearance; history of chronic kidney disease, stage V; history of gout; hypertension; history of congestive heart failure.   DIET: The patient is being discharged on a low-sodium, low-fat diet.   ACTIVITY: As tolerated.   FOLLOWUP: With [facility] Yvon[facility] at [facility] in the next 1 to 2 weeks.    DISCHARGE MEDICATIONS: Synthroid 100 mcg daily, Plavix 75 mg daily, Imdur 30 mg daily, sodium bicarbonate 650 mg b.i.d., cetirizine 10 mg daily, aspirin 81 mg daily, Lasix 80 mg b.i.d., hydralazine 100 mg t.i.d., ranitidine 150 mg b.i.d., HCTZ 25 mg daily, losartan 100 mg daily, allopurinol 100 mg daily, ibuprofen 200 mg q.6 hours as needed, amlodipine 5 mg daily, Colace with Senokot 2 tabs at bedtime as needed.   CONSULTANTS DURING THE HOSPITAL COURSE: Dr. Lady GaryFath from cardiology.   PERTINENT STUDIES DONE DURING THE HOSPITAL COURSE: Are as follows: A 2-dimensional echocardiogram done showing ejection fraction of 40% to 45%, moderately decreased global LV systolic function, moderately dilated left atrium, moderate pleural effusion, moderate mitral valve regurgitation.   BRIEF HOSPITAL COURSE: This is an 81 year old female with medical problems as mentioned above, presented to the hospital with chest pain and some fluttering of her heart.  1.  Chest pain and fluttering of her heart with a mildly elevated troponin: Initially, this was thought to be secondary to a non-ST-elevation MI, but the patient's symptoms had clinically resolved shortly after admission. The patient was seen by cardiology, who did not think that the patient had any evidence of acute coronary syndrome, but  this was likely elevated troponin likely in the setting of poor renal clearance given her chronic kidney disease. The patient was maintained on her aspirin, Plavix, Imdur, and losartan and is currently being discharged on that as she is asymptomatic and hemodynamically stable.  2.  Chronic kidney disease, stage V: The patient's creatinine is currently at baseline. The patient follows up with Dr. Thedore MinsSingh as an outpatient. 3.  Hypertension: The patient's blood pressure remained fairly stable. She will continue on Norvasc, hydralazine, HCTZ, and losartan.  4.  Gout: The patient was maintained on her allopurinol. She will resume that. She is on colchicine t.i.d., which is actually a very high dosage for somebody with chronic kidney disease stage V; therefore, this was discontinued. This is to be further addressed as an outpatient.  5.  Hypothyroidism: The patient was maintained on her Synthroid and she will resume that.   CODE STATUS: The patient is a full code.   DISPOSITION: She is being discharged home.   TIME SPENT: 40 minutes.   ____________________________ Rolly PancakeVivek J. Cherlynn KaiserSainani, MD vjs:jcm D: 08/29/2013 15:25:30 ET T: 08/29/2013 19:18:51 ET JOB#: [id number]  cc: Rolly PancakeVivek J. Cherlynn KaiserSainani, MD, <Dictator> Yvonne RiggsSylvia Mand, FNP [place] SirenVIVEK J SAINANI MD ELECTRONICALLY SIGNED 09/08/2013 20:30

## 2014-07-03 NOTE — H&P (Signed)
PATIENT NAME:  Yvonne Roy, Yvonne Roy MR#:  [id number] DATE OF BIRTH:  05/20/1922  DATE OF ADMISSION:  08/27/2013  PRIMARY CARE PHYSICIAN: [facility]  PRIMARY CARDIOLOGIST: Dr. Gwen Pounds  CHIEF COMPLAINT: Just feel sluggish for the past few days and palpitations.   HISTORY OF PRESENT ILLNESS: A very pleasant 81 year old female with a history of CVA, hypothyroidism, hypertension, myocardial infraction, and paroxysmal atrial fibrillation who presents with the above complaint. Over the past few days, the patient says that she has been feeling very sluggish weak and when she lies on her left side she feels palpitations. She denies any sort of chest pain. She also has had decreased appetite for several days as well, although today in the ER she is hungry. No other symptoms are noted.   REVIEW OF SYSTEMS: CONSTITUTIONAL: No fever. Positive fatigue, weakness.  EYES: No blurred or double vision.  ENT: Positive mild hearing loss. No snoring, postnasal drip. RESPIRATORY: No cough, wheezing, hemoptysis or COPD. CARDIOVASCULAR: No chest pain, orthopnea, edema. Positive paroxysmal atrial fibrillation. No dyspnea on exertion. She has palpitations. GASTROINTESTINAL: No nausea, vomiting, diarrhea, abdominal pain, melena or ulcers. GENITOURINARY: No dysuria or hematuria. ENDOCRINE: No polyuria, polydipsia. HEME AND LYMPH: No easy bruising or bleeding.  SKIN: No rash or lesions.  MUSCULOSKELETAL: She has limited activity due to age.  NEUROLOGIC: Positive history of CVA. No dementia or dysarthria. PSYCHIATRIC: No history of anxiety or depression.  PAST MEDICAL HISTORY: 1.  Hyperlipidemia. 2.  Hypothyroidism. 3.  Chronic kidney disease, stage IV, approaching endstage renal disease. 4.  Hypertension. 5.  Peripheral vascular disease.  6.  CVA. 7.  Coronary artery disease status post cardiac catheterization in 2005 revealing normal left main system, LAD insignificant, left circumflex insignificant. EF at that  time was 60%. 8.  Gout.  FAMILY HISTORY: Positive for diabetes, CAD.  SOCIAL HISTORY: The patient used to smoke a pack to 2 packs for more than 10 years, quit over 30 years ago. No alcohol or IV drug use.   ALLERGIES: No known allergies.   PAST SURGICAL HISTORY:  1.  Hysterectomy.  2.  Right and left cataract surgery over 20+ years ago.  3.  CEA.   MEDICATIONS: 1.  Synthroid 100 mcg daily.  2.  Sodium bicarbonate 650 one tablet b.i.d.  3.  Ranitidine 150 b.i.d.  4.  Losartan 100 mg daily.  5.  Imdur 30 mg daily.  6.  Ibuprofen 200 mg q. 6 hours p.r.n.  7.  HCTZ 25 mg daily. 8.  Hydralazine 100 mg t.i.d.  9.  Lasix 80 mg b.i.d.  10.  Docusate 2 tablets daily.  11.  Colchicine 0.6 mg t.i.d.  12.  Plavix 75 mg daily. 13.  Cetirizine 10 mg daily.  14.  Aspirin 81 mg daily.  15.  Norvasc 5 mg daily.  16.  Allopurinol 100 mg daily.   PHYSICAL EXAMINATION: VITAL SIGNS: Temperature 97.4, pulse 92, respirations 16, blood pressure 161/119, 95% on room air.  GENERAL: The patient is alert and oriented, not in acute distress.  HEENT: Head is atraumatic. Pupils are round and reactive. Sclerae anicteric. Mucous membranes are moist. Oropharynx is clear.  NECK: Supple without JVD, carotid bruit, or enlarged thyroid.  HEART: Tachycardia with a 2/6 systolic ejection murmur heard best at the right sternal border.  LUNGS: Clear to auscultation without crackles, rales, rhonchi, or wheezing. Normal to percussion. No egophony noted.  ABDOMEN: Bowel sounds positive. Nontender, nondistended. No hepatosplenomegaly. No rebound or guarding.  EXTREMITIES: No clubbing,  cyanosis or edema.  NEUROLOGIC: Cranial nerves II through XII are intact. There are no focal deficits.  SKIN: No rashes or lesions.   DIAGNOSTIC DATA: White blood cells 8, hemoglobin 11, hematocrit 35, platelets are 151,000. Sodium 139, potassium 4, chloride 108, bicarbonate 19, BUN 51, creatinine 3.23, glucose 81, calcium 9.   Troponin  0.08.   Chest x-ray shows cardiomegaly.   Lactic acid is 2.1.   EKG shows some ST depressions in V5 and V6.   ASSESSMENT AND PLAN: This is a very pleasant 81 year old female with a history of hypertension and chronic kidney disease currently seen by Dr. Thedore MinsSingh, and hypothyroidism who presents initially with weakness and found to have an elevated troponin. She also complains of palpitations.  1.  Elevated troponin. The patient has chronic kidney disease, has history of some elevation in her troponins, but this is a little higher than normal and with her weakness and vague symptoms of would like to admit her to telemetry to rule out non-ST elevation myocardial infarction. Currently, she is on heparin drip for non-ST elevation myocardial infarction, and we will go ahead and have Dr. Gwen PoundsKowalski see the patient in consultation. Obviously, due to her kidney disease, would not be able to get a cardiac catheterization. I have ordered an echocardiogram. Will continue her on aspirin and Plavix. Continue to cycle her cardiac enzymes and she will be admitted to telemetry. Further evaluation and management per Dr. Gwen PoundsKowalski.  2.  Chronic kidney disease, stage IV. The patient will have consultation with Dr. Thedore MinsSingh if needed. At this time, I do not think it is necessary as her kidney function remains pretty much stable.  3.  Malignant hypertension. The patient's blood pressure is very high, which may be the reason why she is feeling sluggish. Will need to monitor closely. I have increased her Norvasc from 5 to 10 mg and will continue her outpatient medications and further adjustments as needed.  4.  Gout. Will continue her outpatient medications.  5.  Hypothyroidism. Continue Synthroid and due to her symptoms of weakness will check a TSH.   The patient is DNR status.   TIME SPENT: Approximately 45 minutes.   ____________________________ Janyth ContesSital P. Juliene PinaMody, MD spm:sb D: 08/27/2013 14:16:16 ET T: 08/27/2013 15:24:04  ET JOB#: [id number]  cc: Sital P. Juliene PinaMody, MD, <Dictator> [facility] SITAL P MODY MD ELECTRONICALLY SIGNED 08/27/2013 19:32

## 2014-07-03 NOTE — Discharge Summary (Signed)
PATIENT NAME:  Yvonne Roy, [name] MR#:  [id number] DATE OF BIRTH:  [birth year]  DATE OF ADMISSION:  11/25/2013 DATE OF DISCHARGE:  11/28/2013  PRIMARY CARE PHYSICIAN: Bobbye RiggsSylvia Mand, FNP.   DISCHARGE DIAGNOSES: 1. Atrial fibrillation with rapid ventricular response.  2. Acute on chronic systolic congestive heart failure.  3. Fluid overload with end-stage renal disease.  4. Hypertension.  5. Anemia.  6. Thrombocytopenia.   CONDITION: Stable.   CODE STATUS: Full code.   HOME MEDICATIONS: Please refer to the medication recommendation list.   The patient needs home health, physical therapy.   DIET: Low-sodium, low-fat, low-cholesterol, renal diet.   ACTIVITY: As tolerated.   FOLLOWUP CARE: PCP, Dr. Thedore MinsSingh and Dr. [name] within 1 to 2 weeks.   CONSULTATIONS: Dr. Thedore MinsSingh.  REASON FOR ADMISSION: Dizziness, presyncope.   HOSPITAL COURSE: The patient is an 81 year old female with a history of CKD, PVD, CVA, CAD, who presented to the ED with dizziness and presyncope. She was noticed to be in atrial fibrillation with RVR in the ED and was treated with Lopressor IV. Her heart rate is controlled in the 80s. The patient's chest x-ray showed congestive heart failure. For a detailed history and physical examination, please refer to the admission note dictated by Dr. Randol KernElgergawy.   LABORATORY DATA: On admission date showed BNP 12601, glucose 106, BUN 52, creatinine 3.24. Electrolytes were normal. Troponin 0.5. Hemoglobin 10.9.   HOSPITAL COURSE:  1. Atrial fibrillation with RVR. The patient was admitted to telemetry floor. The patient has been treated with aspirin, Plavix and Lopressor. The patient needs to followup with Dr. [name] for recommendation for anticoagulation.  2. Elevated troponin with a history of CAD, CVA possibly due to demand ischemia due to atrial fibrillation in the setting of CKD. The patient has been treated with aspirin and Plavix as mentioned above.  3. For acute on chronic CHF. The  patient was treated with Lasix 80 mg p.o. daily. Since the patient has CKD stage 5, I requested a nephrology consult. Dr. Thedore MinsSingh suggested the patient had ESRD with fluid overload and needs to start on hemodialysis. The patient already has an AV fistula, so the patient was started hemodialysis, twice. The patient's symptoms have much improved. She has no complaints.  4. Hypertension has been controlled with hypertension medications.  5. Anemia, stable.  6. Thrombocytopenia. The patient platelets decreased to 58,000. We stopped heparin subcutaneous for DVT prophylaxis. The patient's platelets are 66,000 today. The patient needs a followup CBC as an outpatient.  7. The patient has no complaints. Vital signs stable. Physical examination is unremarkable. The patient is clinically stable and will be discharged to home with home health and PT today. I discussed the patient's discharge plan with the patient, nurse, case manager and Dr. Thedore MinsSingh.   TIME SPENT: About 79 minutes.    ____________________________ Shaune PollackQing Jakyia Gaccione, MD qc:TT D: 11/28/2013 13:26:43 ET T: 11/28/2013 17:21:17 ET JOB#: [id number]  cc: Shaune PollackQing Yared Barefoot, MD, <Dictator> Shaune PollackQING Jabre Heo MD ELECTRONICALLY SIGNED 11/30/2013 12:00

## 2014-07-03 NOTE — Consult Note (Signed)
   Present Illness Pt with history of multivessel cad by cardiac cath in 2005 treated medically, history of intermittant afib, chronic kidney dissease with GFR of 14 who was admitted after noting palp;itations, especially when she laid on her left side. In the er, ekg revealed afib with controlled vr and no evidence of acute injury or ischemia. She denied chest pain. She had serum troponin drawn which was 0.12.  She has been treated with asa, plavix, for or cad. Her hypertension has been treated with amlodipine, isosorbide mononitrate and hydralazine and losartan. She is currently in afib with controlled rate. No chest pain or sob.   Physical Exam:  GEN well developed, well nourished, no acute distress   NECK supple   RESP normal resp effort  clear BS   CARD Irregular rate and rhythm   ABD denies tenderness   LYMPH negative neck, negative axillae   EXTR negative cyanosis/clubbing, negative edema   SKIN normal to palpation   NEURO cranial nerves intact   PSYCH A+O to time, place, person   Review of Systems:  General: No Complaints   Skin: No Complaints   ENT: No Complaints   Eyes: No Complaints   Neck: No Complaints   Respiratory: No Complaints   Cardiovascular: Palpitations   Gastrointestinal: No Complaints   Genitourinary: No Complaints   Vascular: No Complaints   Musculoskeletal: No Complaints   Neurologic: No Complaints   Hematologic: No Complaints   Endocrine: No Complaints   Psychiatric: No Complaints   Review of Systems: All other systems were reviewed and found to be negative   Medications/Allergies Reviewed Medications/Allergies reviewed   Family & Social History:  Family and Social History:  Family History Non-Contributory   EKG:  Interpretation afib with controlled vr. no ischemia    Clonidine: Bradycardia, Hypotension   Impression 81 yo female with history of cad treated medically with agressive regimen including asa, plavix, nitrates.  Admitted with palpiations with intermiant afib. Has ckd with gfr of 14. Mild troponin elevation. No chest pain. Elevated troponin is likley demand ischemia in the face of renal insuffiency. Not a candidate for inivasive cardiac evaluation given renal funciton and comorbid condition. Would continue with asa, plavix, nitrates. Will defer beta blockers due to relative bradycardia. Not candidate for chronic antiocagulation due to fall and bleeding risk. CHADSS score is 2.   Plan 1. Continue with asa, plavix and imdur. 2. Continue cozaar, hctz and hydralazine.  3. Defer statin at present  4. Defer chronic anticoagulation 5. Ambulate and consider discharge when stable.   Electronic Signatures: Dalia HeadingFath, Kenneth A (MD)  (Signed 19-Jun-15 15:01)  Authored: General Aspect/Present Illness, History and Physical Exam, Review of System, Family & Social History, EKG , Allergies, Impression/Plan   Last Updated: 19-Jun-15 15:01 by Dalia HeadingFath, Kenneth A (MD)

## 2014-07-03 NOTE — H&P (Signed)
PATIENT NAME:  Yvonne Roy, SCHLOESSER MR#:  [id number] DATE OF BIRTH:  [birth year]  DATE OF ADMISSION:  11/25/2013    PRIMARY CARE PHYSICIAN: [facility].   CARDIOLOGIST: Dr. Gwen Pounds.  NEPHROLOGIST:  Dr. Mosetta Pigeon.   CHIEF COMPLAINT: Dizziness, presyncope.   HISTORY OF PRESENT ILLNESS: This is an 81 year old female with known history of CVA, hypothyroidism, hypertension, coronary artery disease, paroxysmal atrial fibrillation, congestive heart failure, ejection fraction of 45%, who presents with complaints of a few episodes of feeling dizziness spells with presyncope, almost like fainting and passing out, but no fainting. No loss of consciousness no fall, as well she denies any chest pain, any shortness of breath, any nausea or vomiting, as well. She denies any focal deficits, tingling or numbness, the patient was not orthostatic in the ED, but she was noticed to be in atrial fibrillation with rapid ventricular response. She is known to have history of atrial fibrillation and paroxysmal atrial fibrillation in the past, heart rate improved after she was given IV metoprolol 5 mg push. Currently, the patient is in atrial fibrillation, rate controlled. Heart rate in the 80s, as well the patient was not orthostatic in the ED, she was given 250 mL of fluid bolus in total. The patient is known to have a history of chronic kidney disease stage IV on hemodialysis, which appears to be stable at baseline, she was found to have elevated troponin at 0.5. Most recent troponin we have is 0.14 and this is elevation from the previous one but she denies any chest pain or any shortness of breath. Hospitalist service was requested to admit the patient for further treatment. Chest x-ray did show evidence consistent with congestive heart failure.   PAST MEDICAL HISTORY:  1. Hyperlipidemia.  2. Hypothyroidism.  3. Chronic kidney disease stage IV approaching end-stage renal disease.  4. Hypertension.  5. Peripheral vascular  disease. 6.  CVA. 7. Coronary artery disease.  8. Gout.  9. Congestive heart failure last ejection fraction of 45% on echo in June of 2015.   ALLERGIES: No known drug allergies.   SOCIAL HISTORY: The patient used to smoke 2 packs a day for more than 10 years quit over 30 years ago. No alcohol. No illicit drug use.   FAMILY HISTORY: Significant for diabetes and coronary artery disease.   PAST SURGICAL HISTORY:  1. Hysterectomy.  2. Right and left cataract surgery over 20 years ago.  3. CAA.   HOME MEDICATIONS:  1. Aspirin 81 mg oral daily.  2. Losartan 100 mg oral daily. 3. Sodium bicarbonate 650 mg oral 2 times a day.  4. Imdur 30 mg oral daily.  5. Allopurinol 120 mg 120 mg oral daily.  6. Colchicine 3 times a day as needed.  7. Plavix 75 mg oral daily.  8. Amlodipine 5 mg oral daily.  9. Lasix 80 mg oral 2 times a day.  10. Hydrochlorothiazide 25 mg oral daily.  11. Ranitidine 150 mg oral 2 times a day.  12. Docusate/Senna as needed. . 14. Hydralazine 100 mg oral 3 times a day.   REVIEW OF SYSTEMS:  CONSTITUTIONAL: Denies fever or chills, weight gain, weight loss. Reports fatigue and weakness.  EYES: Denies blurred vision, double vision, inflammation.  EARS, NOSE, THROAT: Denies tinnitus, ear pain, hearing loss, epistaxis.   RESPIRATORY: Denies cough, wheezing, hemoptysis, COPD.  CARDIOVASCULAR: Denies chest pain, palpitations, syncope. Reports dizziness, presyncope, and worsening lower extremity edema.  GASTROINTESTINAL: Denies nausea, vomiting, diarrhea, pain, hematemesis.  GENITOURINARY: Denies dysuria,  hematuria, renal colic.  ENDOCRINE: Denies polyuria or polydipsia, heat or cold intolerance. HEMATOLOGIC: Denies anemia or any further bleeding varices.  INTEGUMENTARY: Denies acne, rash, or skin lesion.  MUSCULOSKELETAL: Reports history of gout. Denies cramps, arthritis.  NEUROLOGIC: Denies any tremors, vertigo, ataxia. Reports dizziness, feeling like passing out,  but no focal deficits, tingling, numbness.  PSYCHIATRIC: Denies anxiety, insomnia, or depression.   PHYSICAL EXAMINATION:  VITAL SIGNS: Temperature 97.4, pulse 82, respiratory rate 24, blood pressure 142/96. Saturating 96% on room air. Upon presentation pulse was 131.  GENERAL: Elderly female who looks comfortable in bed, in no apparent distress.  HEENT: Head atraumatic, normocephalic. Pupils equal, reactive to light. Pink conjunctivae. Anicteric sclerae. Moist oral mucosa. No nasal discharge. No oral thrush.  NECK: Supple. No thyromegaly. No carotid bruits, JVD +8 to 10 cm bilaterally. Trachea is midline.  CHEST: Good air entry bilaterally. No wheezing, rales, rhonchi. No crackles. No use of accessory muscle.  CARDIOVASCULAR: S1, S2 heard. No rubs, murmur or gallops. PMI is nondisplaced.  ABDOMEN: Soft, nontender, nondistended. Bowel sounds present. No hepatosplenomegaly.  EXTREMITIES: +2 to 3 bilaterally edema. No clubbing. No cyanosis. Pedal pulses felt bilaterally.  PSYCHIATRIC: Appropriate affect. Awake, alert x 3. Intact judgment and insight.  NEUROLOGIC: Cranial nerves grossly intact. Motor 5/5. No focal deficits. Sensation symmetrical and intact to light touch.  MUSCULOSKELETAL: No joint effusion or erythema.   PERTINENT LABORATORIES: BNP 12,601,  glucose 106, BUN 52, creatinine 3.24, sodium 137, potassium 4.9, chloride 108. Troponin 0.5. CK-MB 2.5. White blood cells 6.4, hemoglobin 10.9, hematocrit 35.9, platelets 163,000.   Chest x-ray showing findings consistent with congestive heart failure.   ASSESSMENT AND PLAN:  1. Presyncope dizziness, this is most likely in the setting of her atrial fibrillation with rapid ventricular response causing these symptoms, The patient will be admitted to telemetry. We will continue to cycle her cardiac enzymes. She had recent echo done showing ejection fraction of 45% so no need to repeat it. We will consult cardiology to see her for further  recommendation regarding atrial fibrillation regarding starting beta blockers, even though in the past it was not started due to bradycardia, so hopefully observing her on telemetry will give us a better idea if she can be started on it or not. As well we will further make a decision regarding anticoagulation for her atrial fibrillation up to cardiology at this point.  2. Elevated troponin. She denies any chest pain or any shortness of breath. This is most likely demand ischemia due to her atrial fibrillation with rapid ventricular response in the setting of chronic kidney disease, she was given aspirin. We will hold on starting any further anticoagulation pending her repeat troponin as well, especially in the setting of normal CK-MB.  3. Congestive heart failure known to us with chronic systolic congestive heart failure, appears to be acute on chronic given her evidence of elevated BMP with worsening lower extremity edema and chest x-ray findings. We will hold her fluids at this point, we will resume her back on her baseline home dose diuresis. We will check daily weights, ins and outs.  4. Hypertension. Blood pressure is acceptable. We will resume her back on her home medication.  5. Chronic kidney disease stage IV appears to be stable at this point. We will monitor closely as she is on diuresis.  6. History of cerebrovascular accident. Denies any focal deficits. She is on aspirin and Plavix.  7. History of coronary artery disease. The patient is on aspirin  and Plavix. Denies any chest pain or any shortness of breath, we will continue her on losartan, we will see if patient is appropriate to start on beta blockers depends on her heart rate on the telemetry.  8. Gout. Continue with allopurinol.  9. Deep vein thrombosis prophylaxis. Subcutaneous heparin.   CODE STATUS: The patient is full code. Discussed with the patient and daughter at bedside total.  TOTAL TIME SPENT ON ADMISSION AND PATIENT CARE: 60  minutes.    ____________________________ Starleen Arms, MD dse:JT D: 11/25/2013 01:49:18 ET T: 11/25/2013 03:56:14 ET JOB#: [id number]  cc: Starleen Arms, MD, <Dictator> DAWOOD Teena Irani MD ELECTRONICALLY SIGNED 12/03/2013 5:48

## 2014-07-04 NOTE — Op Note (Signed)
PATIENT NAME:  [name], [name] MR#:  [id number] DATE OF BIRTH:  [birth year]  DATE OF PROCEDURE:  09/19/2011  PREOPERATIVE DIAGNOSIS: Stage V renal insufficiency.   POSTOPERATIVE DIAGNOSIS: Stage V renal insufficiency.  PROCEDURE PERFORMED: Creation of a left brachiocephalic fistula.   SURGEON: Renford DillsGregory G. Syeda Prickett, MD   ANESTHESIA: General by LMA.   FLUIDS: Per anesthesia record.   ESTIMATED BLOOD LOSS: Minimal.   SPECIMEN: None.   INDICATIONS: Ms. Yvonne Roy is an 81 year old woman who has been steadily worsening with respect to her renal function. She is now undergoing creation of a fistula in preparation for eventual dialysis. Risks and benefits were reviewed. All questions answered. The patient agrees to proceed.   DESCRIPTION OF PROCEDURE: The patient is taken to the operating room and placed in the supine position. After adequate general anesthesia is induced, appropriate invasive monitors are placed and appropriate time-out is given. The left arm is prepped and draped in a sterile fashion. 0.25% Marcaine is infiltrated into the skin and subsequently a curvilinear incision is made in the left antecubital fossa.  The antecubital crossing vein is quite nice and is exposed and skeletonized proximally and distally. The brachial artery is then exposed and looped proximally and distally. The vein is then ligated with 2-0 silk distally and transected after being marked with a surgical marker. Vessel loops are used to achieve hemostasis and arteriotomy is made, extended with Potts scissors, and a vein to side brachial artery anastomosis is fashioned with running 6-0 Prolene. Further dissection along the course of the vein resulted in transection of a small side branch and this is then controlled with  6-0 Prolene U stitch on the cephalic vein. The wound is then inspected for hemostasis and closed in layers using interrupted 3-0 Vicryl followed by 4-0 Monocryl subcuticular and Dermabond. The patient  tolerated the procedure well and there were no immediate complications. Sponge and needle counts were correct. She was taken to the recovery area in excellent condition.   ____________________________ Renford DillsGregory G. Ellarose Brandi, MD ggs:bjt D: 09/19/2011 08:47:10 ET T: 09/19/2011 09:53:23 ET JOB#: [id number]  cc: Renford DillsGregory G. Kaneesha Constantino, MD, <Dictator> Renford DillsGREGORY G Bradyn Soward MD ELECTRONICALLY SIGNED 10/02/2011 12:58

## 2014-07-11 NOTE — Op Note (Signed)
PATIENT NAME:  Yvonne Roy, [name] MR#:  [id number] DATE OF BIRTH:  Mar 05, 1923  DATE OF PROCEDURE:  05/04/2014  PREOPERATIVE DIAGNOSES:  1.  Atherosclerotic occlusive disease, bilateral lower extremities, with ulceration of the right great toe.  2.  End-stage renal disease requiring hemodialysis.  3.  Hypertension.  4.  Coronary artery disease.   POSTOPERATIVE DIAGNOSES: 1.  Atherosclerotic occlusive disease, bilateral lower extremities, with ulceration of the right great toe.  2.  End-stage renal disease requiring hemodialysis.  3.  Hypertension.  4.  Coronary artery disease.   PROCEDURE PERFORMED: 1.  Abdominal aortogram.  2.  Right lower extremity distal runoff, third order catheter placement.   SURGEON: Levora DredgeGregory Jaziah Kwasnik, MD   SEDATION: Versed plus fentanyl.   CONTRAST USED: Isovue 70 mL.   FLUOROSCOPY TIME: 10.8 minutes.   INDICATIONS: Ms. Yvonne Roy is an 81 year old woman who presented to the office with ulceration of the right great toe. It appears to be consistent with an ulceration over gouty arthropathy in association with nonpalpable pedal pulses. Noninvasive studies as well as physical exam demonstrated profound atherosclerotic occlusive disease and she is undergoing angiography to better evaluate her arterial system with the hope of intervention for limb salvage. The risks and benefits are reviewed. Family and patient agreed to proceed.   DESCRIPTION OF PROCEDURE: The patient is taken to special procedures and placed in the supine position. After adequate sedation is achieved, both groins are prepped and draped in sterile fashion. Ultrasound is placed in a sterile sleeve. Common femoral artery is identified. It is echolucent and pulsatile indicating patency. Images recorded for the permanent record. Under an area that has minimal plaque formation, 1% lidocaine is infiltrated, a micropuncture needle is inserted, and microwire is advanced. J-wire will not advance past the proximal  external iliac artery under fluoroscopy and, therefore, Glidewire is advanced. Subsequently the wire is negotiated into the aorta and a pigtail catheter is advanced to the level of T12.   AP projection of the aorta is obtained. Pigtail catheter is repositioned and bilateral oblique views of the pelvis are obtained. Rim catheter and stiff angled Glidewire used to cross the aortic bifurcation. The catheter is advanced down to the distal external iliac where an RAO projection of the groin is obtained. The catheter is then exchanged for a Kumpe catheter and the Glidewire and Kumpe catheter is negotiated into the SFA where distal runoff is obtained. After review of the images, the catheter is pulled back into the aorta and the wire is then advanced. Catheter is removed and oblique view of the left groin is obtained and a Mynx device deployed without difficulty. There are no immediate complications.   INTERPRETATION: The entire arterial system demonstrates profound atherosclerotic changes with heavy dense calcifications very easily visualized with plain fluoroscopy. There are hemodynamically significant stenoses at multiple levels throughout the common iliac arteries bilaterally. Both internal iliac arteries are heavily diseased but patent. The external iliac arteries are patent and appear relatively spared. Of note, the common femoral arteries bilaterally appear to be relatively spared as well. Also of note, cardiac output appears to be fairly diminished as injection of contrast in the distal aorta requires almost 10 seconds to opacify the common femoral.   The right common femoral and profunda femoris are patent although again, there is diffuse heavily calcified stenosis noted. The origin of the SFA is profoundly diseased extending for several centimeters at which point it then occludes. The above-knee popliteal is reconstituted and appears to fill the patent  trifurcation. The posterior tibial and peroneal are  patent down to the ankle. Anterior tibial appears to occlude distally.   SUMMARY: Profound multilevel disease which would be exceedingly difficult to reconstruct given the patient's age. I would favor a plan of conservative therapy. Her toe does not appear to be infected. As long as the family is aware that this is a distinct possibility given the ulceration and that action would be taken if indeed infection occurs and they are willing to accept this, I would favor this conservative therapy given her age and her profound disease with limited options for reconstruction.   On the other hand, if indeed we are forced to proceed with revascularization, her aortoiliac disease would have to be treated. I would opt for utilizing kissing Viabahn stents to allow for aggressive re-expansion given the heavy calcific areas. If these stents were brought down to just proximal to the iliac bifurcations, there are adequate landing zones noted bilaterally. Subsequently then optimal therapy would be a fem to above-knee popliteal which could likely be done under a spinal. I believe this would yield a more expeditious reconstruction compared to attempting intervention through such a long occlusion. Also the origin of the SFA represents a profound problem and has a high probability of being inadequately treated with angioplasty and not an adequate place for stent placement, therefore, I would opt for this multilevel reconstruction using both endovascular as well as open techniques but obviously only if we were to have a more catastrophic change to her foot and this is all relative to her age, end-stage renal disease, coronary disease and overall comorbidities.   ____________________________ Renford Dills, MD ggs:mc D: 05/04/2014 09:41:06 ET T: 05/04/2014 12:05:40 ET JOB#: [id number]  cc: Renford Dills, MD, <Dictator> Bobbye Riggs, FNP Renford Dills MD ELECTRONICALLY SIGNED 05/18/2014 14:25

## 2015-02-10 ENCOUNTER — Emergency Department: Payer: Medicare Other

## 2015-02-10 ENCOUNTER — Inpatient Hospital Stay
Admission: EM | Admit: 2015-02-10 | Discharge: 2015-02-12 | DRG: 064 | Disposition: A | Discharge status: Home / Self Care | Payer: Medicare Other | Attending: Internal Medicine | Admitting: Internal Medicine

## 2015-02-10 ENCOUNTER — Inpatient Hospital Stay: Payer: Medicare Other

## 2015-02-10 ENCOUNTER — Encounter: Payer: Self-pay | Admitting: Emergency Medicine

## 2015-02-10 DIAGNOSIS — E559 Vitamin D deficiency, unspecified: Secondary | ICD-10-CM | POA: Diagnosis present

## 2015-02-10 DIAGNOSIS — Z7982 Long term (current) use of aspirin: Secondary | ICD-10-CM | POA: Diagnosis not present

## 2015-02-10 DIAGNOSIS — E039 Hypothyroidism, unspecified: Secondary | ICD-10-CM | POA: Diagnosis present

## 2015-02-10 DIAGNOSIS — N2581 Secondary hyperparathyroidism of renal origin: Secondary | ICD-10-CM | POA: Diagnosis present

## 2015-02-10 DIAGNOSIS — M109 Gout, unspecified: Secondary | ICD-10-CM | POA: Diagnosis present

## 2015-02-10 DIAGNOSIS — Z992 Dependence on renal dialysis: Secondary | ICD-10-CM

## 2015-02-10 DIAGNOSIS — R410 Disorientation, unspecified: Secondary | ICD-10-CM | POA: Diagnosis not present

## 2015-02-10 DIAGNOSIS — I120 Hypertensive chronic kidney disease with stage 5 chronic kidney disease or end stage renal disease: Secondary | ICD-10-CM | POA: Diagnosis present

## 2015-02-10 DIAGNOSIS — R7989 Other specified abnormal findings of blood chemistry: Secondary | ICD-10-CM

## 2015-02-10 DIAGNOSIS — Z888 Allergy status to other drugs, medicaments and biological substances status: Secondary | ICD-10-CM

## 2015-02-10 DIAGNOSIS — Z79899 Other long term (current) drug therapy: Secondary | ICD-10-CM | POA: Diagnosis not present

## 2015-02-10 DIAGNOSIS — I248 Other forms of acute ischemic heart disease: Secondary | ICD-10-CM | POA: Diagnosis present

## 2015-02-10 DIAGNOSIS — I639 Cerebral infarction, unspecified: Secondary | ICD-10-CM | POA: Diagnosis present

## 2015-02-10 DIAGNOSIS — N186 End stage renal disease: Secondary | ICD-10-CM

## 2015-02-10 DIAGNOSIS — Z8673 Personal history of transient ischemic attack (TIA), and cerebral infarction without residual deficits: Secondary | ICD-10-CM

## 2015-02-10 DIAGNOSIS — D631 Anemia in chronic kidney disease: Secondary | ICD-10-CM | POA: Diagnosis present

## 2015-02-10 DIAGNOSIS — R778 Other specified abnormalities of plasma proteins: Secondary | ICD-10-CM

## 2015-02-10 DIAGNOSIS — I10 Essential (primary) hypertension: Secondary | ICD-10-CM

## 2015-02-10 DIAGNOSIS — R4701 Aphasia: Secondary | ICD-10-CM

## 2015-02-10 DIAGNOSIS — R4182 Altered mental status, unspecified: Secondary | ICD-10-CM

## 2015-02-10 DIAGNOSIS — I160 Hypertensive urgency: Secondary | ICD-10-CM

## 2015-02-10 HISTORY — DX: Chronic kidney disease, unspecified: N18.9

## 2015-02-10 HISTORY — DX: Anemia in chronic kidney disease: D63.1

## 2015-02-10 HISTORY — DX: Gout, unspecified: M10.9

## 2015-02-10 HISTORY — DX: Hypothyroidism, unspecified: E03.9

## 2015-02-10 HISTORY — DX: Vitamin D deficiency, unspecified: E55.9

## 2015-02-10 HISTORY — DX: Iron deficiency anemia, unspecified: D50.9

## 2015-02-10 HISTORY — DX: End stage renal disease: N18.6

## 2015-02-10 LAB — COMPREHENSIVE METABOLIC PANEL
ALT: 11 U/L — ABNORMAL LOW (ref 14–54)
AST: 19 U/L (ref 15–41)
Albumin: 3.7 g/dL (ref 3.5–5.0)
Alkaline Phosphatase: 79 U/L (ref 38–126)
Anion gap: 10 (ref 5–15)
BUN: 37 mg/dL — AB (ref 6–20)
CHLORIDE: 98 mmol/L — AB (ref 101–111)
CO2: 28 mmol/L (ref 22–32)
CREATININE: 4.88 mg/dL — AB (ref 0.44–1.00)
Calcium: 9.2 mg/dL (ref 8.9–10.3)
GFR, EST AFRICAN AMERICAN: 8 mL/min — AB (ref >60)
GFR, EST NON AFRICAN AMERICAN: 7 mL/min — AB (ref >60)
Glucose, Bld: 133 mg/dL — ABNORMAL HIGH (ref 65–99)
POTASSIUM: 4.1 mmol/L (ref 3.5–5.1)
SODIUM: 136 mmol/L (ref 135–145)
TOTAL PROTEIN: 8.5 g/dL — AB (ref 6.5–8.1)
Total Bilirubin: 0.8 mg/dL (ref 0.3–1.2)

## 2015-02-10 LAB — GLUCOSE, CAPILLARY: Glucose-Capillary: 132 mg/dL — ABNORMAL HIGH (ref 65–99)

## 2015-02-10 LAB — CBC WITH DIFFERENTIAL/PLATELET
Basophils Absolute: 0.1 10*3/uL (ref 0–0.1)
Basophils Relative: 1 %
EOS ABS: 0.4 10*3/uL (ref 0–0.7)
EOS PCT: 4 %
HCT: 37.7 % (ref 35.0–47.0)
Hemoglobin: 12.1 g/dL (ref 12.0–16.0)
LYMPHS ABS: 2.1 10*3/uL (ref 1.0–3.6)
LYMPHS PCT: 20 %
MCH: 29.5 pg (ref 26.0–34.0)
MCHC: 32.1 g/dL (ref 32.0–36.0)
MCV: 92 fL (ref 80.0–100.0)
MONO ABS: 1.7 10*3/uL — AB (ref 0.2–0.9)
Monocytes Relative: 16 %
Neutro Abs: 6.1 10*3/uL (ref 1.4–6.5)
Neutrophils Relative %: 59 %
PLATELETS: 189 10*3/uL (ref 150–440)
RBC: 4.09 MIL/uL (ref 3.80–5.20)
RDW: 15.5 % — ABNORMAL HIGH (ref 11.5–14.5)
WBC: 10.4 10*3/uL (ref 3.6–11.0)

## 2015-02-10 LAB — TROPONIN I
Troponin I: 0.05 ng/mL — ABNORMAL HIGH (ref <0.031)
Troponin I: 0.05 ng/mL — ABNORMAL HIGH (ref <0.031)
Troponin I: 0.06 ng/mL — ABNORMAL HIGH (ref <0.031)

## 2015-02-10 LAB — MRSA PCR SCREENING: MRSA BY PCR: NEGATIVE

## 2015-02-10 MED ORDER — FAMOTIDINE 20 MG PO TABS
10.0000 mg | ORAL_TABLET | Freq: Every day | ORAL | Status: DC
  Administered 2015-02-10 – 2015-02-12 (×3): 10 mg via ORAL
  Filled 2015-02-10: qty 1
  Filled 2015-02-10: qty 2
  Filled 2015-02-10: qty 1

## 2015-02-10 MED ORDER — SODIUM CHLORIDE 0.9 % IJ SOLN: 3.0000 mL | INTRAMUSCULAR | Status: DC | PRN

## 2015-02-10 MED ORDER — LORAZEPAM 2 MG/ML IJ SOLN: 0.5000 mg | Freq: Once | INTRAMUSCULAR | Status: DC | PRN

## 2015-02-10 MED ORDER — ASPIRIN EC 81 MG PO TBEC
81.0000 mg | DELAYED_RELEASE_TABLET | Freq: Every day | ORAL | Status: DC
  Administered 2015-02-10 – 2015-02-12 (×3): 81 mg via ORAL
  Filled 2015-02-10 (×3): qty 1

## 2015-02-10 MED ORDER — LOSARTAN POTASSIUM 25 MG PO TABS
50.0000 mg | ORAL_TABLET | Freq: Once | ORAL | Status: AC
  Administered 2015-02-10: 50 mg via ORAL
  Filled 2015-02-10: qty 2

## 2015-02-10 MED ORDER — LIDOCAINE-PRILOCAINE 2.5-2.5 % EX CREA: TOPICAL_CREAM | Freq: Every day | CUTANEOUS | Status: DC | PRN

## 2015-02-10 MED ORDER — SODIUM CHLORIDE 0.9 % IJ SOLN
3.0000 mL | Freq: Two times a day (BID) | INTRAMUSCULAR | Status: DC
Start: 2015-02-10 — End: 2015-02-12
  Administered 2015-02-10 – 2015-02-12 (×4): 3 mL via INTRAVENOUS

## 2015-02-10 MED ORDER — SODIUM CHLORIDE 0.9 % IJ SOLN
3.0000 mL | Freq: Two times a day (BID) | INTRAMUSCULAR | Status: DC
  Administered 2015-02-10 – 2015-02-12 (×4): 3 mL via INTRAVENOUS

## 2015-02-10 MED ORDER — SODIUM CHLORIDE 0.9 % IV SOLN: 250.0000 mL | INTRAVENOUS | Status: DC | PRN

## 2015-02-10 MED ORDER — FUROSEMIDE 40 MG PO TABS
80.0000 mg | ORAL_TABLET | Freq: Every day | ORAL | Status: DC
  Administered 2015-02-10 – 2015-02-12 (×3): 80 mg via ORAL
  Filled 2015-02-10 (×3): qty 2

## 2015-02-10 MED ORDER — ACETAMINOPHEN 325 MG PO TABS: 650.0000 mg | ORAL_TABLET | Freq: Four times a day (QID) | ORAL | Status: DC | PRN

## 2015-02-10 MED ORDER — LEVOTHYROXINE SODIUM 50 MCG PO TABS
100.0000 ug | ORAL_TABLET | Freq: Every day | ORAL | Status: DC
  Administered 2015-02-11 – 2015-02-12 (×2): 100 ug via ORAL
  Filled 2015-02-10: qty 1
  Filled 2015-02-10: qty 2

## 2015-02-10 MED ORDER — ONDANSETRON HCL 4 MG/2ML IJ SOLN: 4.0000 mg | Freq: Four times a day (QID) | INTRAMUSCULAR | Status: DC | PRN

## 2015-02-10 MED ORDER — ONDANSETRON HCL 4 MG PO TABS: 4.0000 mg | ORAL_TABLET | Freq: Four times a day (QID) | ORAL | Status: DC | PRN

## 2015-02-10 MED ORDER — LORATADINE 10 MG PO TABS
10.0000 mg | ORAL_TABLET | Freq: Every day | ORAL | Status: DC
  Administered 2015-02-10 – 2015-02-12 (×3): 10 mg via ORAL
  Filled 2015-02-10 (×4): qty 1

## 2015-02-10 MED ORDER — HYDRALAZINE HCL 50 MG PO TABS
50.0000 mg | ORAL_TABLET | Freq: Three times a day (TID) | ORAL | Status: DC
  Administered 2015-02-10 (×2): 50 mg via ORAL
  Filled 2015-02-10: qty 2
  Filled 2015-02-10: qty 1

## 2015-02-10 MED ORDER — CLOPIDOGREL BISULFATE 75 MG PO TABS
75.0000 mg | ORAL_TABLET | Freq: Every day | ORAL | Status: DC
  Administered 2015-02-10 – 2015-02-12 (×3): 75 mg via ORAL
  Filled 2015-02-10 (×4): qty 1

## 2015-02-10 MED ORDER — ISOSORBIDE MONONITRATE ER 30 MG PO TB24
30.0000 mg | ORAL_TABLET | Freq: Every day | ORAL | Status: DC
  Administered 2015-02-10: 30 mg via ORAL
  Filled 2015-02-10: qty 1

## 2015-02-10 MED ORDER — ALLOPURINOL 100 MG PO TABS
100.0000 mg | ORAL_TABLET | Freq: Every day | ORAL | Status: DC
  Administered 2015-02-10 – 2015-02-12 (×3): 100 mg via ORAL
  Filled 2015-02-10 (×3): qty 1

## 2015-02-10 MED ORDER — ACETAMINOPHEN 650 MG RE SUPP: 650.0000 mg | Freq: Four times a day (QID) | RECTAL | Status: DC | PRN

## 2015-02-10 MED ORDER — LABETALOL HCL 5 MG/ML IV SOLN
10.0000 mg | INTRAVENOUS | Status: DC | PRN
  Filled 2015-02-10 (×2): qty 4

## 2015-02-10 MED ORDER — LOSARTAN POTASSIUM 50 MG PO TABS: 50.0000 mg | ORAL_TABLET | Freq: Every day | ORAL | Status: DC

## 2015-02-10 MED ORDER — METOPROLOL TARTRATE 25 MG PO TABS
25.0000 mg | ORAL_TABLET | Freq: Two times a day (BID) | ORAL | Status: DC
  Filled 2015-02-10: qty 1

## 2015-02-10 MED ORDER — CALCIUM ACETATE 667 MG PO CAPS
1334.0000 mg | ORAL_CAPSULE | Freq: Three times a day (TID) | ORAL | Status: DC
  Administered 2015-02-11 – 2015-02-12 (×4): 1334 mg via ORAL
  Filled 2015-02-10 (×11): qty 2

## 2015-02-10 MED ORDER — HEPARIN SODIUM (PORCINE) 5000 UNIT/ML IJ SOLN
5000.0000 [IU] | Freq: Three times a day (TID) | INTRAMUSCULAR | Status: DC
  Administered 2015-02-10 – 2015-02-12 (×5): 5000 [IU] via SUBCUTANEOUS
  Filled 2015-02-10 (×5): qty 1

## 2015-02-10 NOTE — ED Notes (Signed)
Pt to ED via EMS c/o AMS today.  EMS states pt was at routine dialysis when had sudden onset trouble remembering and "talking out of head".  Pt arrived alert and oriented to self and place.  Pt is disoriented to time and situation.  Pt states "something is wrong but I don't know what".  Pt is speaking in complete sentences and in NAD at this time.

## 2015-02-10 NOTE — ED Notes (Signed)
Pt from dialysis with sudden change in mental status during treatment, pt oriented to self, pt very pleasant, pt denies pain or any concerns,

## 2015-02-10 NOTE — ED Notes (Signed)
Critical Troponin 0.05, Dr Silverio LayYao notified

## 2015-02-10 NOTE — ED Provider Notes (Signed)
CSN: [id number]     Arrival date & time 02/10/15  1244 History   First MD Initiated Contact with Patient 02/10/15 1246     Chief Complaint  Patient presents with  . Altered Mental Status     (Consider location/radiation/quality/duration/timing/severity/associated sxs/prior Treatment) The history is provided by the patient. The history is limited by the condition of the patient.  Yvonne Roy is a 81 y.o. female hx of ESRD on HD here with AMS. Patient is in dialysis today and during dialysis, patient started getting disoriented. Per the staff there, she would just not herself. Per EMS, she was disoriented initially but now has been more alert. Patient states that she feels fine right now. She doesn't remember what happened. Denies passing out and denies any chest pain shortness of breath or abdominal pain or vomiting. Denies any fevers or chills.  Level V caveat- AMS    Past Medical History  Diagnosis Date  . Anemia in chronic kidney disease   . Gout   . Iron deficiency anemia   . Vitamin D deficiency   . End stage renal disease (HCC)   . Hypothyroidism    Past Surgical History  Procedure Laterality Date  . Dialysis fistula creation Left    History reviewed. No pertinent family history. Social History  Substance Use Topics  . Smoking status: Never Smoker   . Smokeless tobacco: None  . Alcohol Use: No   OB History    No data available     Review of Systems  Unable to perform ROS: Mental status change  All other systems reviewed and are negative.     Allergies  Clonidine derivatives  Home Medications   Prior to Admission medications   Medication Sig Start Date End Date Taking? Authorizing Provider  allopurinol (ZYLOPRIM) 100 MG tablet Take 1 tablet by mouth daily. 01/25/15  Yes Historical Provider, MD  aspirin EC 81 MG tablet Take 1 tablet by mouth daily.   Yes Historical Provider, MD  calcium acetate (PHOSLO) 667 MG capsule Take 1,334 mg by mouth 3 (three) times  daily with meals.   Yes Historical Provider, MD  cetirizine (ZYRTEC) 10 MG tablet Take 1 tablet by mouth daily. 01/18/15  Yes Historical Provider, MD  clopidogrel (PLAVIX) 75 MG tablet Take 75 mg by mouth daily. 01/18/15  Yes Historical Provider, MD  furosemide (LASIX) 80 MG tablet Take 1 tablet by mouth daily. 01/18/15  Yes Historical Provider, MD  isosorbide mononitrate (IMDUR) 30 MG 24 hr tablet Take 1 tablet by mouth daily. 01/25/15  Yes Historical Provider, MD  lidocaine-prilocaine (EMLA) cream APPLY TO SKIN 3 TIMES A WEEK AS DIRECTED TO ACESS SITE 30 MINUTES BEFORE TREATMENT 01/17/15  Yes Historical Provider, MD  losartan (COZAAR) 50 MG tablet Take 50 mg by mouth daily. 01/10/15  Yes Historical Provider, MD  metoprolol (LOPRESSOR) 50 MG tablet Take 25 mg by mouth 2 (two) times daily. 01/28/15  Yes Historical Provider, MD  ranitidine (ZANTAC) 150 MG tablet Take 150 mg by mouth 2 (two) times daily. 01/28/15  Yes Historical Provider, MD  SYNTHROID 100 MCG tablet Take 100 mcg by mouth daily. 01/10/15  Yes Historical Provider, MD   BP 193/68 mmHg  Pulse 50  Temp(Src) 98.5 F (36.9 C) (Oral)  Resp 19  Ht 5\' 2"  (1.575 m)  Wt 110 lb (49.896 kg)  BMI 20.11 kg/m2  SpO2 100% Physical Exam  Constitutional: She is oriented to person, place, and time.  Chronically ill, NAD  HENT:  Head: Normocephalic and atraumatic.  Mouth/Throat: Oropharynx is clear and moist.  Eyes: Conjunctivae are normal. Pupils are equal, round, and reactive to light.  Neck: Normal range of motion. Neck supple.  Cardiovascular: Normal rate, regular rhythm and normal heart sounds.   Pulmonary/Chest: Effort normal and breath sounds normal. No respiratory distress. She has no wheezes. She has no rales.  Abdominal: Soft. Bowel sounds are normal. She exhibits no distension. There is no tenderness. There is no rebound.  Musculoskeletal: Normal range of motion. She exhibits no edema or tenderness.  Neurological: She is alert and  oriented to person, place, and time. No cranial nerve deficit. Coordination normal.  Skin: Skin is warm and dry.  Psychiatric: She has a normal mood and affect. Her behavior is normal. Judgment and thought content normal.  Nursing note and vitals reviewed.   ED Course  Procedures (including critical care time) Labs Review Labs Reviewed  CBC WITH DIFFERENTIAL/PLATELET - Abnormal; Notable for the following:    RDW 15.5 (*)    Monocytes Absolute 1.7 (*)    All other components within normal limits  COMPREHENSIVE METABOLIC PANEL - Abnormal; Notable for the following:    Chloride 98 (*)    Glucose, Bld 133 (*)    BUN 37 (*)    Creatinine, Ser 4.88 (*)    Total Protein 8.5 (*)    ALT 11 (*)    GFR calc non Af Amer 7 (*)    GFR calc Af Amer 8 (*)    All other components within normal limits  TROPONIN I - Abnormal; Notable for the following:    Troponin I 0.05 (*)    All other components within normal limits    Imaging Review Dg Chest 2 View  02/10/2015  CLINICAL DATA:  Altered mental status after routine dialysis EXAM: CHEST  2 VIEW COMPARISON:  11/24/2013 FINDINGS: Cardiomediastinal silhouette is stable. No acute infiltrate or pleural effusion. No pulmonary edema. Atherosclerotic calcifications of thoracic aorta. Central mild vascular congestion IMPRESSION: Cardiomegaly. Central mild vascular congestion without pulmonary edema. Atherosclerotic calcifications of thoracic aorta. No segmental infiltrate. Electronically Signed   By: Natasha Mead M.D.   On: 02/10/2015 14:24   Ct Head Wo Contrast  02/10/2015  CLINICAL DATA:  Confusion and altered mental status. Dialysis patient EXAM: CT HEAD WITHOUT CONTRAST TECHNIQUE: Contiguous axial images were obtained from the base of the skull through the vertex without intravenous contrast. COMPARISON:  CT 05/03/2012 FINDINGS: Moderate to advanced atrophy. Chronic lacunar infarction left basal ganglia unchanged. Chronic infarct right frontal lobe has  developed since prior study. Negative for acute infarct.  Negative for hemorrhage or mass. Negative for skull lesion. IMPRESSION: Atrophy and chronic ischemia.  No acute abnormality. Electronically Signed   By: Marlan Palau M.D.   On: 02/10/2015 14:30   I have personally reviewed and evaluated these images and lab results as part of my medical decision-making.   EKG Interpretation None       ED ECG REPORT I, YAO, DAVID, the attending physician, personally viewed and interpreted this ECG.   Date: 02/10/2015  EKG Time:12:57 PM  Rate: 57  Rhythm: normal EKG, normal sinus rhythm, PAC's noted  Axis: normal  Intervals:LVH  ST&T Change: nonspecific   MDM   Final diagnoses:  None   Mikalia P Palm is a 81 y.o. female here with AMS. Now A &O x 3, nonfocal neuro exam. Patient hypertensive in the ED. Consider bleed vs electrolyte imbalance. Back to baseline. Will  get CT head, labs, EKG.   3:00 PM CT head unremarkable. Trop mildly positive 0.05. Given daily dose of cozaar but not improved. Will admit for observation for possible hypertensive encephalopathy.     Richardean Canal, MD 02/10/15 [phone]

## 2015-02-10 NOTE — Progress Notes (Signed)
   02/10/15 1900  Clinical Encounter Type  Visited With Family  Visit Type Initial;Spiritual support  Consult/Referral To Chaplain  Spiritual Encounters  Spiritual Needs Emotional  Stress Factors  Family Stress Factors None identified  Chaplain rounded in the unit and offered a compassionate presence and support as applicable to the family after an introduction. Chaplain Sharunda Salmon A. Keith Cancio Ext. [phone]

## 2015-02-10 NOTE — Progress Notes (Signed)
Mrs. Yvonne Roy was admitted to CCU for hypertension. Consult for nephrology and cardiology ordered. Pt given oral meds without any difficulty. Spoke with Dr. Seth BakeV and she is aware of MRI results. Physician also stated that a systolic BP in the 180's or lower is ok. Pt resting quietly. Sinus brady on the monitor.

## 2015-02-10 NOTE — H&P (Signed)
[facility] [place] [facility]   PATIENT NAME: Yvonne Roy    MR#:  [id number]  DATE OF BIRTH:  04-03-1922  DATE OF ADMISSION:  02/10/2015  PRIMARY CARE PHYSICIAN: No primary care provider on file.   REQUESTING/REFERRING PHYSICIAN:   CHIEF COMPLAINT:   Chief Complaint  Patient presents with  . Altered Mental Status    HISTORY OF PRESENT ILLNESS: Yvonne Roy  is a 81 y.o. female with a known history of end-stage renal disease, anemia of chronic disease, hypothyroidism, hypertension who presents to the hospital from hemodialysis center when she was found to be disoriented and somewhat agitated and did not know where she was. Unfortunately, family does not know much what happened and patient herself is not able to provide any review of systems, she keeps repeating that she does not know what happened to her and has difficulty expressing herself. Her systolic blood pressure was found to be elevated at around 200s despite full session of hemodialysis earlier today and receiving losartan in emergency room . CT scan of head was unremarkable . Hospitalist services were contacted for admission   PAST MEDICAL HISTORY:   Past Medical History  Diagnosis Date  . Anemia in chronic kidney disease   . Gout   . Iron deficiency anemia   . Vitamin D deficiency   . End stage renal disease (HCC)   . Hypothyroidism     PAST SURGICAL HISTORY:  Past Surgical History  Procedure Laterality Date  . Dialysis fistula creation Left     SOCIAL HISTORY:  Social History  Substance Use Topics  . Smoking status: Never Smoker   . Smokeless tobacco: Not on file  . Alcohol Use: No    FAMILY HISTORY: History reviewed. No pertinent family history.  DRUG ALLERGIES:  Allergies  Allergen Reactions  . Clonidine Derivatives     ROS  MEDICATIONS AT HOME:  Prior to Admission medications   Medication Sig Start Date End Date Taking? Authorizing Provider  allopurinol (ZYLOPRIM)  100 MG tablet Take 1 tablet by mouth daily. 01/25/15  Yes Historical Provider, MD  aspirin EC 81 MG tablet Take 1 tablet by mouth daily.   Yes Historical Provider, MD  calcium acetate (PHOSLO) 667 MG capsule Take 1,334 mg by mouth 3 (three) times daily with meals.   Yes Historical Provider, MD  cetirizine (ZYRTEC) 10 MG tablet Take 1 tablet by mouth daily. 01/18/15  Yes Historical Provider, MD  clopidogrel (PLAVIX) 75 MG tablet Take 75 mg by mouth daily. 01/18/15  Yes Historical Provider, MD  furosemide (LASIX) 80 MG tablet Take 1 tablet by mouth daily. 01/18/15  Yes Historical Provider, MD  isosorbide mononitrate (IMDUR) 30 MG 24 hr tablet Take 1 tablet by mouth daily. 01/25/15  Yes Historical Provider, MD  lidocaine-prilocaine (EMLA) cream APPLY TO SKIN 3 TIMES A WEEK AS DIRECTED TO ACESS SITE 30 MINUTES BEFORE TREATMENT 01/17/15  Yes Historical Provider, MD  losartan (COZAAR) 50 MG tablet Take 50 mg by mouth daily. 01/10/15  Yes Historical Provider, MD  metoprolol (LOPRESSOR) 50 MG tablet Take 25 mg by mouth 2 (two) times daily. 01/28/15  Yes Historical Provider, MD  ranitidine (ZANTAC) 150 MG tablet Take 150 mg by mouth 2 (two) times daily. 01/28/15  Yes Historical Provider, MD  SYNTHROID 100 MCG tablet Take 100 mcg by mouth daily. 01/10/15  Yes Historical Provider, MD      PHYSICAL EXAMINATION:   VITAL SIGNS: Blood pressure 199/63, pulse 49, temperature 98.5  F (36.9 C), temperature source Oral, resp. rate 21, height  (1.575 m), weight 49.896 kg (110 lb), SpO2 97 %.  GENERAL:  81 y.o.-year-old patient lying in the bed with no acute distress.  EYES: Pupils equal, round, reactive to light and accommodation. No scleral icterus. Extraocular muscles intact.  HEENT: Head atraumatic, normocephalic. Oropharynx and nasopharynx clear.  NECK:  Supple, no jugular venous distention. No thyroid enlargement, no tenderness.  LUNGS: Normal breath sounds bilaterally, no wheezing, rales,rhonchi or  crepitation. No use of accessory muscles of respiration.  CARDIOVASCULAR: S1, S2 normal. No murmurs, rubs, or gallops.  ABDOMEN: Soft, nontender, nondistended. Bowel sounds present. No organomegaly or mass.  EXTREMITIES: No pedal edema, cyanosis, or clubbing.  NEUROLOGIC: Cranial nerves II through XII are intact. Muscle strength 5/5 in all extremities. Sensation intact. Gait not checked.  PSYCHIATRIC: The patient is alert and oriented x 3.  SKIN: No obvious rash, lesion, or ulcer.   LABORATORY PANEL:   CBC  Recent Labs Lab 02/10/15 1258  WBC 10.4  HGB 12.1  HCT 37.7  PLT 189  MCV 92.0  MCH 29.5  MCHC 32.1  RDW 15.5*  LYMPHSABS 2.1  MONOABS 1.7*  EOSABS 0.4  BASOSABS 0.1   ------------------------------------------------------------------------------------------------------------------  Chemistries   Recent Labs Lab 02/10/15 1258  NA 136  K 4.1  CL 98*  CO2 28  GLUCOSE 133*  BUN 37*  CREATININE 4.88*  CALCIUM 9.2  AST 19  ALT 11*  ALKPHOS 79  BILITOT 0.8   ------------------------------------------------------------------------------------------------------------------  Cardiac Enzymes  Recent Labs Lab 02/10/15 1258  TROPONINI 0.05*   ------------------------------------------------------------------------------------------------------------------  RADIOLOGY: Dg Chest 2 View  02/10/2015  CLINICAL DATA:  Altered mental status after routine dialysis EXAM: CHEST  2 VIEW COMPARISON:  11/24/2013 FINDINGS: Cardiomediastinal silhouette is stable. No acute infiltrate or pleural effusion. No pulmonary edema. Atherosclerotic calcifications of thoracic aorta. Central mild vascular congestion IMPRESSION: Cardiomegaly. Central mild vascular congestion without pulmonary edema. Atherosclerotic calcifications of thoracic aorta. No segmental infiltrate. Electronically Signed   By: Natasha Mead M.D.   On: 02/10/2015 14:24   Ct Head Wo Contrast  02/10/2015  CLINICAL DATA:   Confusion and altered mental status. Dialysis patient EXAM: CT HEAD WITHOUT CONTRAST TECHNIQUE: Contiguous axial images were obtained from the base of the skull through the vertex without intravenous contrast. COMPARISON:  CT 05/03/2012 FINDINGS: Moderate to advanced atrophy. Chronic lacunar infarction left basal ganglia unchanged. Chronic infarct right frontal lobe has developed since prior study. Negative for acute infarct.  Negative for hemorrhage or mass. Negative for skull lesion. IMPRESSION: Atrophy and chronic ischemia.  No acute abnormality. Electronically Signed   By: Marlan Palau M.D.   On: 02/10/2015 14:30    EKG: Orders placed or performed during the hospital encounter of 02/10/15  . EKG 12-Lead  . EKG 12-Lead  . EKG 12-Lead  . EKG 12-Lead    IMPRESSION AND PLAN:  Principal Problem:   Expressive aphasia Active Problems:   Malignant essential hypertension   Altered mental status   Elevated troponin   End stage renal disease (HCC) 1. Expressive aphasia, concerning for stroke versus hypertensive encephalopathy, admit patient, medical floor, get MRI of the brain, follow clinically, this speech involved for further recommendations 2. Malignant essential hypertension. Add hydralazine orally to her medication regiment and labetalol intravenously as needed, discussed with Dr. Cherylann Ratel in regards to possible need of hemodialysis if systolic blood pressure does not improve due to concerns of volume overload 3. Elevated troponin, likely demand ischemia.  Continue patient on metoprolol as well as aspirin and heparin subcutaneously. Check troponin 3. Good cardiologist involved for further recommendations 4. End-stage renal disease. Nephrologist was consulted,    All the records are reviewed and case discussed with ED provider. Management plans discussed with the patient, family and they are in agreement.  CODE STATUS:    TOTAL TIME TAKING CARE OF THIS PATIENT: 60 minutes     coordination of care time 10 to 15 minutes. On discussion with patient's family   Dniya Neuhaus M.D on 02/10/2015 at 3:32 PM  Between 7am to 6pm - Pager - [phone] After 6pm go to [email or web address] - password [email or web address] [facility]  [place] [facility]  Office  [phone]  CC: Primary care physician; No primary care provider on file.

## 2015-02-11 ENCOUNTER — Inpatient Hospital Stay
Admit: 2015-02-11 | Discharge: 2015-02-11 | Disposition: A | Discharge status: Home / Self Care | Payer: Medicare Other | Attending: Internal Medicine | Admitting: Internal Medicine

## 2015-02-11 ENCOUNTER — Inpatient Hospital Stay: Payer: Medicare Other

## 2015-02-11 DIAGNOSIS — R410 Disorientation, unspecified: Secondary | ICD-10-CM

## 2015-02-11 DIAGNOSIS — R4701 Aphasia: Secondary | ICD-10-CM

## 2015-02-11 LAB — CBC
HEMATOCRIT: 34.2 % — AB (ref 35.0–47.0)
Hemoglobin: 11.2 g/dL — ABNORMAL LOW (ref 12.0–16.0)
MCH: 30.1 pg (ref 26.0–34.0)
MCHC: 32.8 g/dL (ref 32.0–36.0)
MCV: 91.7 fL (ref 80.0–100.0)
Platelets: 154 10*3/uL (ref 150–440)
RBC: 3.73 MIL/uL — ABNORMAL LOW (ref 3.80–5.20)
RDW: 15.6 % — AB (ref 11.5–14.5)
WBC: 7.8 10*3/uL (ref 3.6–11.0)

## 2015-02-11 LAB — BASIC METABOLIC PANEL
Anion gap: 10 (ref 5–15)
BUN: 47 mg/dL — AB (ref 6–20)
CHLORIDE: 100 mmol/L — AB (ref 101–111)
CO2: 26 mmol/L (ref 22–32)
CREATININE: 6.23 mg/dL — AB (ref 0.44–1.00)
Calcium: 8.7 mg/dL — ABNORMAL LOW (ref 8.9–10.3)
GFR calc Af Amer: 6 mL/min — ABNORMAL LOW (ref >60)
GFR calc non Af Amer: 5 mL/min — ABNORMAL LOW (ref >60)
GLUCOSE: 76 mg/dL (ref 65–99)
POTASSIUM: 4.6 mmol/L (ref 3.5–5.1)
Sodium: 136 mmol/L (ref 135–145)

## 2015-02-11 LAB — TROPONIN I
TROPONIN I: 0.06 ng/mL — AB (ref <0.031)
Troponin I: 0.07 ng/mL — ABNORMAL HIGH (ref <0.031)

## 2015-02-11 MED ORDER — HYDRALAZINE HCL 20 MG/ML IJ SOLN: 10.0000 mg | Freq: Four times a day (QID) | INTRAMUSCULAR | Status: DC | PRN

## 2015-02-11 MED ORDER — ATORVASTATIN CALCIUM 20 MG PO TABS
20.0000 mg | ORAL_TABLET | Freq: Every day | ORAL | Status: DC
  Administered 2015-02-11: 20 mg via ORAL
  Filled 2015-02-11: qty 1

## 2015-02-11 MED ORDER — ATORVASTATIN CALCIUM 20 MG PO TABS: 20.0000 mg | ORAL_TABLET | Freq: Every day | ORAL | Status: AC

## 2015-02-11 MED ORDER — ASPIRIN EC 325 MG PO TBEC: 325.0000 mg | DELAYED_RELEASE_TABLET | Freq: Every day | ORAL | Status: DC

## 2015-02-11 NOTE — Progress Notes (Signed)
PT Cancellation Note  Patient Details Name: Yvonne Roy MRN: [id number] DOB: 1922/08/24   Cancelled Treatment:    Reason Eval/Treat Not Completed: Other (comment) (Chart reviewed for attempted treatment session.  Patient currently preparing for transfer to from CCU to medical unit; unavailable for participation with evaluation.  Will re-attempt at later time/date as patient available.)  Yvonne Roy, PT, DPT, NCS 02/11/2015, 2:51 PM [phone]

## 2015-02-11 NOTE — Progress Notes (Signed)
[facility] [place] [facility]   PATIENT NAME: Yvonne GulaJosie Roy    MR#:  [id number]  DATE OF BIRTH:  Mar 10, 1943  SUBJECTIVE:    Patient is doing better this morning. Her speech is improved. She has no other neurological deficits.  REVIEW OF SYSTEMS:    Review of Systems  Constitutional: Negative for fever, chills and malaise/fatigue.  HENT: Negative for sore throat.   Eyes: Negative for blurred vision.  Respiratory: Negative for cough, hemoptysis, shortness of breath and wheezing.   Cardiovascular: Negative for chest pain, palpitations and leg swelling.  Gastrointestinal: Negative for nausea, vomiting, abdominal pain, diarrhea and blood in stool.  Genitourinary: Negative for dysuria.  Musculoskeletal: Negative for back pain.  Neurological: Positive for speech change. Negative for dizziness, tremors and headaches.  Endo/Heme/Allergies: Does not bruise/bleed easily.    Tolerating Diet: Yes      DRUG ALLERGIES:   Allergies  Allergen Reactions  . Clonidine Derivatives     VITALS:  Blood pressure 164/53, pulse 55, temperature 97.1 F (36.2 C), temperature source Oral, resp. rate 31, height 5\' 2"  (1.575 m), weight 49.896 kg (110 lb), SpO2 97 %.  PHYSICAL EXAMINATION:   Physical Exam  Constitutional: She is oriented to person, place, and time and well-developed, well-nourished, and in no distress. No distress.  HENT:  Head: Normocephalic.  Eyes: No scleral icterus.  Neck: Normal range of motion. Neck supple. No JVD present. No tracheal deviation present.  Cardiovascular: Normal rate, regular rhythm and normal heart sounds.  Exam reveals no gallop and no friction rub.   No murmur heard. Pulmonary/Chest: Effort normal and breath sounds normal. No respiratory distress. She has no wheezes. She has no rales. She exhibits no tenderness.  Abdominal: Soft. Bowel sounds are normal. She exhibits no distension and no mass. There is no tenderness. There is  no rebound and no guarding.  Musculoskeletal: Normal range of motion. She exhibits no edema.  Neurological: She is alert and oriented to person, place, and time.  Mild expressive aphasia  Skin: Skin is warm. No rash noted. No erythema.  Psychiatric: Affect and judgment normal.      LABORATORY PANEL:   CBC  Recent Labs Lab 02/11/15 0719  WBC 7.8  HGB 11.2*  HCT 34.2*  PLT 154   ------------------------------------------------------------------------------------------------------------------  Chemistries   Recent Labs Lab 02/10/15 1258 02/11/15 0719  NA 136 136  K 4.1 4.6  CL 98* 100*  CO2 28 26  GLUCOSE 133* 76  BUN 37* 47*  CREATININE 4.88* 6.23*  CALCIUM 9.2 8.7*  AST 19  --   ALT 11*  --   ALKPHOS 79  --   BILITOT 0.8  --    ------------------------------------------------------------------------------------------------------------------  Cardiac Enzymes  Recent Labs Lab 02/10/15 2031 02/10/15 2356 02/11/15 1125  TROPONINI 0.06* 0.07* 0.06*   ------------------------------------------------------------------------------------------------------------------  RADIOLOGY:  Dg Chest 2 View  02/10/2015  CLINICAL DATA:  Altered mental status after routine dialysis EXAM: CHEST  2 VIEW COMPARISON:  11/24/2013 FINDINGS: Cardiomediastinal silhouette is stable. No acute infiltrate or pleural effusion. No pulmonary edema. Atherosclerotic calcifications of thoracic aorta. Central mild vascular congestion IMPRESSION: Cardiomegaly. Central mild vascular congestion without pulmonary edema. Atherosclerotic calcifications of thoracic aorta. No segmental infiltrate. Electronically Signed   By: Natasha MeadLiviu  Pop M.D.   On: 02/10/2015 14:24   Ct Head Wo Contrast  02/10/2015  CLINICAL DATA:  Confusion and altered mental status. Dialysis patient EXAM: CT HEAD WITHOUT CONTRAST TECHNIQUE: Contiguous axial images were obtained from  the base of the skull through the vertex without  intravenous contrast. COMPARISON:  CT 05/03/2012 FINDINGS: Moderate to advanced atrophy. Chronic lacunar infarction left basal ganglia unchanged. Chronic infarct right frontal lobe has developed since prior study. Negative for acute infarct.  Negative for hemorrhage or mass. Negative for skull lesion. IMPRESSION: Atrophy and chronic ischemia.  No acute abnormality. Electronically Signed   By: Marlan Palau M.D.   On: 02/10/2015 14:30   Mr Brain Wo Contrast  02/10/2015  CLINICAL DATA:  Expressive aphasia. EXAM: MRI HEAD WITHOUT CONTRAST TECHNIQUE: Multiplanar, multiecho pulse sequences of the brain and surrounding structures were obtained without intravenous contrast. COMPARISON:  Head CT from earlier today FINDINGS: Calvarium and upper cervical spine: Diffusely hypo intense marrow signal, likely from the patient's end-stage renal disease. The bones do appear sclerotic on prior head CT. Orbits: No significant findings. Sinuses and Mastoids: Clear. Brain: There is cortical restricted diffusion in the left parietal and high occipital lobe, approximately 3 cm area. Appearance is consistent with an acute infarct, along the posterior border zone territory. There has been previous medium size cortical and subcortical infarcts in the high left frontal and right frontal parietal cortex. Chronic subcortical small vessel infarcts with lacunes in the left putamen, right thalamus, and right more than left cerebellum. No evidence of major vessel occlusion. No indication of venous occlusion around the left parietal convexity. No hydrocephalus, acute hemorrhage, or shift. IMPRESSION: 1. Acute infarct in the left parietal and high occipital cortex, nonhemorrhagic. 2. Remote cortical and subcortical infarcts as described above. 3. Sclerotic bones, history suggesting renal osteodystrophy. Electronically Signed   By: Marnee Spring M.D.   On: 02/10/2015 16:46   US Carotid Bilateral  02/11/2015  CLINICAL DATA:  Stroke EXAM:  BILATERAL CAROTID DUPLEX ULTRASOUND TECHNIQUE: Wallace Cullens scale imaging, color Doppler and duplex ultrasound were performed of bilateral carotid and vertebral arteries in the neck. COMPARISON:  03/24/2010 FINDINGS: Criteria: Quantification of carotid stenosis is based on velocity parameters that correlate the residual internal carotid diameter with NASCET-based stenosis levels, using the diameter of the distal internal carotid lumen as the denominator for stenosis measurement. The following velocity measurements were obtained: RIGHT ICA:  83 cm/sec CCA:  64 cm/sec SYSTOLIC ICA/CCA RATIO:  1.3 DIASTOLIC ICA/CCA RATIO:  2.7 ECA:  74 cm/sec LEFT ICA:  212 cm/sec CCA:  74 cm/sec SYSTOLIC ICA/CCA RATIO:  2.9 DIASTOLIC ICA/CCA RATIO:  0 ECA:  224 cm/sec RIGHT CAROTID ARTERY: Mild calcified plaque in the upper common carotid. Moderate mixed plaque in the bulb. Low resistance internal carotid Doppler pattern is preserved. Rhythm is irregular. RIGHT VERTEBRAL ARTERY:  Antegrade with a normal Doppler pattern. LEFT CAROTID ARTERY: There is moderate irregular plaque in the bulb. Calcified plaque does shadow the vessel lumen. There is very little diastolic flow in portions of the internal carotid. LEFT VERTEBRAL ARTERY: Antegrade. Doppler pattern is abnormal with very poor diastolic flow. IMPRESSION: Less than 50% stenosis in the right internal carotid artery. Normal antegrade flow in the right vertebral artery. 50-69% stenosis in the left internal carotid artery. Abnormal antegrade Doppler flow pattern in the left vertebral artery as described. Significant narrowing cannot be excluded. Electronically Signed   By: Jolaine Click M.D.   On: 02/11/2015 12:12     ASSESSMENT AND PLAN:   This is a 81 year old female with end-stage renal disease on hemodialysis who presented with malignant hypertension expressive aphasia and now found to have an acute stroke.  1. Acute infarct in the left parietal and  high occipital cortex,  nonhemorrhagic: Patient is currently on aspirin and Plavix which will be continued. I have started statin. She will undergo physical therapy consultation. Her carotid ultrasound shows 50-69% blockage in the left internal carotid artery. I discussed the findings with the patient's family. Given her age and comorbidities they do not want the patient have further workup including CTA or ASCUS surgery consultation. They are well aware the patient has an increased risk of stroke. They accept these risks. Due to the findings patient's family would like the patient to stay in the hospital overnight for further neurological checks. 2-D echocardiogram is pending.  2. Essential hypertension: Due to problem #1 patient needs to allow brain perfusion. Hydralazine when necessary has been ordered with parameters.  3. End-stage renal disease and Hemoccults: Patient will receive dialysis tomorrow. Appreciate nephrology consultation.  4. Hypothyroidism: Continue Synthroid    Management plans discussed with the patient and daughter and she is in agreement.  CODE STATUS: Full  TOTAL TIME TAKING CARE OF THIS PATIENT: 30 minutes.     POSSIBLE D/C tomorrow, DEPENDING ON CLINICAL CONDITION.   Phung Kotas M.D on 02/11/2015 at 1:29 PM  Between 7am to 6pm - Pager - [phone] After 6pm go to [email or web address] - password [email or web address] [facility]  [place] [facility]  Office  [phone]  CC: Primary care physician; No primary care provider on file.  Note: This dictation was prepared with Dragon dictation along with smaller phrase technology. Any transcriptional errors that result from this process are unintentional.

## 2015-02-11 NOTE — Progress Notes (Signed)
[facility]  ROUNDING NOTE   Subjective:  Patient well known to [place]. Was seen during HD yesterday. Became confused during HD. Was transferred here for further evaluation. Thought to possibly have hypertensive encephalopathy.  Still confused this AM. Didn't know her name this AM when asked but did follow simple commands   Objective:  Vital signs in last 24 hours:  Temp:  [97.4 F (36.3 C)-98.7 F (37.1 C)] 97.4 F (36.3 C) (12/02 0300) Pulse Rate:  [46-63] 48 (12/02 0500) Resp:  [16-31] 18 (12/02 0500) BP: (108-202)/(34-76) 114/52 mmHg (12/02 0559) SpO2:  [95 %-100 %] 95 % (12/02 0500) Weight:  [49.896 kg (110 lb)] 49.896 kg (110 lb) (12/01 1621)  Weight change:  Filed Weights   02/10/15 1250  Weight: 49.896 kg (110 lb)    Intake/Output:     Intake/Output this shift:     Physical Exam: General: NAD, resting in bed  Head: Normocephalic, atraumatic. Moist oral mucosal membranes  Eyes: Anicteric  Neck: Supple, trachea midline  Lungs:  Clear to auscultation  Heart: S1S2 no rubs  Abdomen:  Soft, nontender, BS present  Extremities: No peripheral edema.  Neurologic: Awake, confused, didn't know her name, follows commands with extremeties  Skin: No lesions  Access: LUE AVF    Basic Metabolic Panel:  Recent Labs Lab 02/10/15 1258 02/11/15 0719  NA 136 136  K 4.1 4.6  CL 98* 100*  CO2 28 26  GLUCOSE 133* 76  BUN 37* 47*  CREATININE 4.88* 6.23*  CALCIUM 9.2 8.7*    Liver Function Tests:  Recent Labs Lab 02/10/15 1258  AST 19  ALT 11*  ALKPHOS 79  BILITOT 0.8  PROT 8.5*  ALBUMIN 3.7   No results for input(s): LIPASE, AMYLASE in the last 168 hours. No results for input(s): AMMONIA in the last 168 hours.  CBC:  Recent Labs Lab 02/10/15 1258 02/11/15 0719  WBC 10.4 7.8  NEUTROABS 6.1  --   HGB 12.1 11.2*  HCT 37.7 34.2*  MCV 92.0 91.7  PLT 189 154    Cardiac Enzymes:  Recent Labs Lab 02/10/15 1258 02/10/15 1648  02/10/15 2031 02/10/15 2356  TROPONINI 0.05* 0.05* 0.06* 0.07*    BNP: Invalid input(s): POCBNP  CBG:  Recent Labs Lab 02/10/15 1808  GLUCAP 132*    Microbiology: Results for orders placed or performed during the hospital encounter of 02/10/15  MRSA PCR Screening     Status: None   Collection Time: 02/10/15  6:10 PM  Result Value Ref Range Status   MRSA by PCR NEGATIVE NEGATIVE Final    Comment:        The GeneXpert MRSA Assay (FDA approved for NASAL specimens only), is one component of a comprehensive MRSA colonization surveillance program. It is not intended to diagnose MRSA infection nor to guide or monitor treatment for MRSA infections.     Coagulation Studies: No results for input(s): LABPROT, INR in the last 72 hours.  Urinalysis: No results for input(s): COLORURINE, LABSPEC, PHURINE, GLUCOSEU, HGBUR, BILIRUBINUR, KETONESUR, PROTEINUR, UROBILINOGEN, NITRITE, LEUKOCYTESUR in the last 72 hours.  Invalid input(s): APPERANCEUR    Imaging: Dg Chest 2 View  02/10/2015  CLINICAL DATA:  Altered mental status after routine dialysis EXAM: CHEST  2 VIEW COMPARISON:  11/24/2013 FINDINGS: Cardiomediastinal silhouette is stable. No acute infiltrate or pleural effusion. No pulmonary edema. Atherosclerotic calcifications of thoracic aorta. Central mild vascular congestion IMPRESSION: Cardiomegaly. Central mild vascular congestion without pulmonary edema. Atherosclerotic calcifications of thoracic aorta. No segmental  infiltrate. Electronically Signed   By: Natasha Mead M.D.   On: 02/10/2015 14:24   Ct Head Wo Contrast  02/10/2015  CLINICAL DATA:  Confusion and altered mental status. Dialysis patient EXAM: CT HEAD WITHOUT CONTRAST TECHNIQUE: Contiguous axial images were obtained from the base of the skull through the vertex without intravenous contrast. COMPARISON:  CT 05/03/2012 FINDINGS: Moderate to advanced atrophy. Chronic lacunar infarction left basal ganglia unchanged.  Chronic infarct right frontal lobe has developed since prior study. Negative for acute infarct.  Negative for hemorrhage or mass. Negative for skull lesion. IMPRESSION: Atrophy and chronic ischemia.  No acute abnormality. Electronically Signed   By: Marlan Palau M.D.   On: 02/10/2015 14:30   Mr Brain Wo Contrast  02/10/2015  CLINICAL DATA:  Expressive aphasia. EXAM: MRI HEAD WITHOUT CONTRAST TECHNIQUE: Multiplanar, multiecho pulse sequences of the brain and surrounding structures were obtained without intravenous contrast. COMPARISON:  Head CT from earlier today FINDINGS: Calvarium and upper cervical spine: Diffusely hypo intense marrow signal, likely from the patient's end-stage renal disease. The bones do appear sclerotic on prior head CT. Orbits: No significant findings. Sinuses and Mastoids: Clear. Brain: There is cortical restricted diffusion in the left parietal and high occipital lobe, approximately 3 cm area. Appearance is consistent with an acute infarct, along the posterior border zone territory. There has been previous medium size cortical and subcortical infarcts in the high left frontal and right frontal parietal cortex. Chronic subcortical small vessel infarcts with lacunes in the left putamen, right thalamus, and right more than left cerebellum. No evidence of major vessel occlusion. No indication of venous occlusion around the left parietal convexity. No hydrocephalus, acute hemorrhage, or shift. IMPRESSION: 1. Acute infarct in the left parietal and high occipital cortex, nonhemorrhagic. 2. Remote cortical and subcortical infarcts as described above. 3. Sclerotic bones, history suggesting renal osteodystrophy. Electronically Signed   By: Marnee Spring M.D.   On: 02/10/2015 16:46     Medications:     . allopurinol  100 mg Oral Daily  . aspirin EC  81 mg Oral Daily  . calcium acetate  1,334 mg Oral TID WC  . clopidogrel  75 mg Oral Daily  . famotidine  10 mg Oral Daily  . furosemide   80 mg Oral Daily  . heparin  5,000 Units Subcutaneous 3 times per day  . levothyroxine  100 mcg Oral QAC breakfast  . loratadine  10 mg Oral Daily  . metoprolol  25 mg Oral BID  . sodium chloride  3 mL Intravenous Q12H  . sodium chloride  3 mL Intravenous Q12H   sodium chloride, acetaminophen **OR** acetaminophen, labetalol, lidocaine-prilocaine, LORazepam, ondansetron **OR** ondansetron (ZOFRAN) IV, sodium chloride  Assessment/ Plan:  81 y.o. female with pmhx of ESRD on HD, anemia of CKD, SHPTH, malignant hypertension, hx of CVA. LUE AVF, hypothyroidism, gout who presented with malignant HTN and confusion during HD.   HD [facility]/Dr. Thedore Mins  1. End-stage renal disease on hemodialysis [facility]TH[facility]:  Patient had hemodialysis yesterday. No urgent indication for dialysis at present as blood pressure is under better control. We will plan for dialysis again tomorrow.  2. Anemia of chronic kidney disease.  Hemoglobin currently 11.2. We will hold Epogen for now given concerns of underlying CVA.  3. Secondary hyperparathyroidism. Continue PhosLo 2 tablets by mouth 3 times a day with meals.  4. Hypertension. Patient has history of malignant hypertension in the past. Blood pressure currently well controlled. She appears to have labile blood pressure.  5. Altered mental status/confusion. Could be secondary to hypertensive encephalopathy. However she remains confused at present. Consider neurology consultation as well as MRI of the brain. Patient has prior history of CVAs.   LOS: 1 Knolan Simien 12/2/20168:23 AM

## 2015-02-11 NOTE — Progress Notes (Signed)
*  PRELIMINARY RESULTS* Echocardiogram 2D Echocardiogram has been performed.  Yvonne HousekeeperJerry R Roy 02/11/2015, 11:39 AM

## 2015-02-11 NOTE — Care Management Note (Signed)
Case Management Note  Patient Details  Name: Yvonne Roy MRN: [id number] Date of Birth: Jul 10, 1922  Subjective/Objective:    RNCM  consult for discharge planning. TC to patients home and spoke with both sisters by phone.  Patient lives at home with her daughters who provide 24 hour care. They are not aware of discharge and are very upset. Plan is for daughters to come to the ICU and speak with the MD further. PT consult pending. Daughter's are not sure of discharge plan but open to PT recommendations.                Action/Plan: Will follow and monitor for PT recommendations.   Expected Discharge Date:                  Expected Discharge Plan:     In-House Referral:     Discharge planning Services  CM Consult  Post Acute Care Choice:    Choice offered to:     DME Arranged:    DME Agency:     HH Arranged:    HH Agency:     Status of Service:  In process, will continue to follow  Medicare Important Message Given:    Date Medicare IM Given:    Medicare IM give by:    Date Additional Medicare IM Given:    Additional Medicare Important Message give by:     If discussed at Long Length of Stay Meetings, dates discussed:    Additional Comments:  Marily MemosLisa M Clair Alfieri, RN 02/11/2015, 12:51 PM

## 2015-02-11 NOTE — Consult Note (Signed)
CC: dysarthria.    HPI: AMAIA Roy is an 81 y.o. female female with a known history of end-stage renal disease, anemia of chronic disease, hypothyroidism, hypertension who presents to the hospital from hemodialysis center when she was found to be disoriented and somewhat agitated and did not know where she was.  Pt was found to have a stroke on left parietal and high occipital cortex.  Mental status improved and close to baseline.   Past Medical History  Diagnosis Date  . Anemia in chronic kidney disease   . Gout   . Iron deficiency anemia   . Vitamin D deficiency   . End stage renal disease (HCC)   . Hypothyroidism     Past Surgical History  Procedure Laterality Date  . Dialysis fistula creation Left     History reviewed. No pertinent family history.  Social History:  reports that she has never smoked. She does not have any smokeless tobacco history on file. She reports that she does not drink alcohol or use illicit drugs.  Allergies  Allergen Reactions  . Clonidine Derivatives     Medications: I have reviewed the patient's current medications.  ROS: Unable to obtain due to confusion   Physical Examination: Blood pressure 138/56, pulse 59, temperature 97.1 F (36.2 C), temperature source Oral, resp. rate 19, height  (1.575 m), weight 110 lb (49.896 kg), SpO2 96 %.   Neurological Examination Mental Status: Alert to name.  Speech fluent wCranial Nerves: II: Discs flat bilaterally; Visual fields grossly normal, pupils equal, round, reactive to light and accommodation III,IV, VI: ptosis not present, extra-ocular motions intact bilaterally V,VII: smile symmetric, facial light touch sensation normal bilaterally VIII: hearing normal bilaterally IX,X: gag reflex present XI: bilateral shoulder shrug XII: midline tongue extension Motor: Right : Upper extremity   4/5    Left:     Upper extremity   4/5  Lower extremity   4/5     Lower extremity   4/5 Tone and  bulk:normal tone throughout; no atrophy noted Sensory: Pinprick and light touch intact throughout, bilaterally Deep Tendon Reflexes: 2+ and symmetric throughout Plantars: Right: downgoing   Left: downgoing Cerebellar: Not tested.       Laboratory Studies:   Basic Metabolic Panel:  Recent Labs Lab 02/10/15 1258 02/11/15 0719  NA 136 136  K 4.1 4.6  CL 98* 100*  CO2 28 26  GLUCOSE 133* 76  BUN 37* 47*  CREATININE 4.88* 6.23*  CALCIUM 9.2 8.7*    Liver Function Tests:  Recent Labs Lab 02/10/15 1258  AST 19  ALT 11*  ALKPHOS 79  BILITOT 0.8  PROT 8.5*  ALBUMIN 3.7   No results for input(s): LIPASE, AMYLASE in the last 168 hours. No results for input(s): AMMONIA in the last 168 hours.  CBC:  Recent Labs Lab 02/10/15 1258 02/11/15 0719  WBC 10.4 7.8  NEUTROABS 6.1  --   HGB 12.1 11.2*  HCT 37.7 34.2*  MCV 92.0 91.7  PLT 189 154    Cardiac Enzymes:  Recent Labs Lab 02/10/15 1258 02/10/15 1648 02/10/15 2031 02/10/15 2356 02/11/15 1125  TROPONINI 0.05* 0.05* 0.06* 0.07* 0.06*    BNP: Invalid input(s): POCBNP  CBG:  Recent Labs Lab 02/10/15 1808  GLUCAP 132*    Microbiology: Results for orders placed or performed during the hospital encounter of 02/10/15  MRSA PCR Screening     Status: None   Collection Time: 02/10/15  6:10 PM  Result Value Ref  Range Status   MRSA by PCR NEGATIVE NEGATIVE Final    Comment:        The GeneXpert MRSA Assay (FDA approved for NASAL specimens only), is one component of a comprehensive MRSA colonization surveillance program. It is not intended to diagnose MRSA infection nor to guide or monitor treatment for MRSA infections.     Coagulation Studies: No results for input(s): LABPROT, INR in the last 72 hours.  Urinalysis: No results for input(s): COLORURINE, LABSPEC, PHURINE, GLUCOSEU, HGBUR, BILIRUBINUR, KETONESUR, PROTEINUR, UROBILINOGEN, NITRITE, LEUKOCYTESUR in the last 168 hours.  Invalid  input(s): APPERANCEUR  Lipid Panel:     Component Value Date/Time   CHOL 80 08/28/2013 0358   TRIG 39 08/28/2013 0358   HDL 26* 08/28/2013 0358   VLDL 8 08/28/2013 0358   LDLCALC 46 08/28/2013 0358    HgbA1C: No results found for: HGBA1C  Urine Drug Screen:  No results found for: LABOPIA, COCAINSCRNUR, LABBENZ, AMPHETMU, THCU, LABBARB  Alcohol Level: No results for input(s): ETH in the last 168 hours.   Imaging: Dg Chest 2 View  02/10/2015  CLINICAL DATA:  Altered mental status after routine dialysis EXAM: CHEST  2 VIEW COMPARISON:  11/24/2013 FINDINGS: Cardiomediastinal silhouette is stable. No acute infiltrate or pleural effusion. No pulmonary edema. Atherosclerotic calcifications of thoracic aorta. Central mild vascular congestion IMPRESSION: Cardiomegaly. Central mild vascular congestion without pulmonary edema. Atherosclerotic calcifications of thoracic aorta. No segmental infiltrate. Electronically Signed   By: Natasha Mead M.D.   On: 02/10/2015 14:24   Ct Head Wo Contrast  02/10/2015  CLINICAL DATA:  Confusion and altered mental status. Dialysis patient EXAM: CT HEAD WITHOUT CONTRAST TECHNIQUE: Contiguous axial images were obtained from the base of the skull through the vertex without intravenous contrast. COMPARISON:  CT 05/03/2012 FINDINGS: Moderate to advanced atrophy. Chronic lacunar infarction left basal ganglia unchanged. Chronic infarct right frontal lobe has developed since prior study. Negative for acute infarct.  Negative for hemorrhage or mass. Negative for skull lesion. IMPRESSION: Atrophy and chronic ischemia.  No acute abnormality. Electronically Signed   By: Marlan Palau M.D.   On: 02/10/2015 14:30   Mr Brain Wo Contrast  02/10/2015  CLINICAL DATA:  Expressive aphasia. EXAM: MRI HEAD WITHOUT CONTRAST TECHNIQUE: Multiplanar, multiecho pulse sequences of the brain and surrounding structures were obtained without intravenous contrast. COMPARISON:  Head CT from earlier  today FINDINGS: Calvarium and upper cervical spine: Diffusely hypo intense marrow signal, likely from the patient's end-stage renal disease. The bones do appear sclerotic on prior head CT. Orbits: No significant findings. Sinuses and Mastoids: Clear. Brain: There is cortical restricted diffusion in the left parietal and high occipital lobe, approximately 3 cm area. Appearance is consistent with an acute infarct, along the posterior border zone territory. There has been previous medium size cortical and subcortical infarcts in the high left frontal and right frontal parietal cortex. Chronic subcortical small vessel infarcts with lacunes in the left putamen, right thalamus, and right more than left cerebellum. No evidence of major vessel occlusion. No indication of venous occlusion around the left parietal convexity. No hydrocephalus, acute hemorrhage, or shift. IMPRESSION: 1. Acute infarct in the left parietal and high occipital cortex, nonhemorrhagic. 2. Remote cortical and subcortical infarcts as described above. 3. Sclerotic bones, history suggesting renal osteodystrophy. Electronically Signed   By: Marnee Spring M.D.   On: 02/10/2015 16:46   US Carotid Bilateral  02/11/2015  CLINICAL DATA:  Stroke EXAM: BILATERAL CAROTID DUPLEX ULTRASOUND TECHNIQUE: Wallace Cullens scale imaging, color  Doppler and duplex ultrasound were performed of bilateral carotid and vertebral arteries in the neck. COMPARISON:  03/24/2010 FINDINGS: Criteria: Quantification of carotid stenosis is based on velocity parameters that correlate the residual internal carotid diameter with NASCET-based stenosis levels, using the diameter of the distal internal carotid lumen as the denominator for stenosis measurement. The following velocity measurements were obtained: RIGHT ICA:  83 cm/sec CCA:  64 cm/sec SYSTOLIC ICA/CCA RATIO:  1.3 DIASTOLIC ICA/CCA RATIO:  2.7 ECA:  74 cm/sec LEFT ICA:  212 cm/sec CCA:  74 cm/sec SYSTOLIC ICA/CCA RATIO:  2.9 DIASTOLIC  ICA/CCA RATIO:  0 ECA:  224 cm/sec RIGHT CAROTID ARTERY: Mild calcified plaque in the upper common carotid. Moderate mixed plaque in the bulb. Low resistance internal carotid Doppler pattern is preserved. Rhythm is irregular. RIGHT VERTEBRAL ARTERY:  Antegrade with a normal Doppler pattern. LEFT CAROTID ARTERY: There is moderate irregular plaque in the bulb. Calcified plaque does shadow the vessel lumen. There is very little diastolic flow in portions of the internal carotid. LEFT VERTEBRAL ARTERY: Antegrade. Doppler pattern is abnormal with very poor diastolic flow. IMPRESSION: Less than 50% stenosis in the right internal carotid artery. Normal antegrade flow in the right vertebral artery. 50-69% stenosis in the left internal carotid artery. Abnormal antegrade Doppler flow pattern in the left vertebral artery as described. Significant narrowing cannot be excluded. Electronically Signed   By: Jolaine ClickArthur  Hoss M.D.   On: 02/11/2015 12:12     Assessment/Plan:  81 y.o. female female with a known history of end-stage renal disease, anemia of chronic disease, hypothyroidism, hypertension who presents to the hospital from hemodialysis center when she was found to be disoriented and somewhat agitated and did not know where she was.  Pt was found to have a stroke on left parietal and high occipital cortex.  Mental status improved and close to baseline. These strokes could be embolic in nature but given age, co morbidities do not think pt would be a good candidate for anticoagulation Anti platelet therapy No further imaging from neuro stand point.   Pauletta BrownsZEYLIKMAN, Amberlyn Martinezgarcia    02/11/2015, 2:32 PM

## 2015-02-11 NOTE — Plan of Care (Signed)
Problem: Education: Goal: Knowledge of disease or condition will improve Outcome: Progressing Educated pt and family about stroke prevention and the importance of mobility. Pt and family verbalized understanding.  Problem: Coping: Goal: Ability to identify appropriate support needs will improve Outcome: Progressing Pt calm, cooperative and agrees with the plan of care. No anxiety noted.  Problem: Health Behavior/Discharge Planning: Goal: Ability to manage health-related needs will improve Outcome: Progressing Pt consult pending. Hemodialysis pt with anuria.  Problem: Self-Care: Goal: Ability to participate in self-care as condition permits will improve Outcome: Progressing Pt need moderate assist.  Goal: Verbalization of feelings and concerns over difficulty with self-care will improve Outcome: Progressing Pt with expressive aphasia. Attended to pt needs with safety checks and prn.  Problem: Nutrition: Goal: Risk of aspiration will decrease Outcome: Progressing No difficulty swallowing noted. Aspiration precautions maintained with optimal positioning during meal times. Good appetite.  Problem: Tissue Perfusion: Goal: Cerebral tissue perfusion will improve Outcome: Progressing VSS. Teds on. Denies pain. NIH score 7. No neuro changes during the shift.

## 2015-02-11 NOTE — Progress Notes (Signed)
Per Dr Juliene PinaMody patient ok to travel without nursing.

## 2015-02-11 NOTE — Evaluation (Signed)
Clinical/Bedside Swallow Evaluation Patient Details  Name: Yvonne Roy MRN: [id number] Date of Birth: 06/22/1922  Today's Date: 02/11/2015 Time: SLP Start Time (ACUTE ONLY): 1025 SLP Stop Time (ACUTE ONLY): 1110 SLP Time Calculation (min) (ACUTE ONLY): 45 min  Past Medical History:  Past Medical History  Diagnosis Date  . Anemia in chronic kidney disease   . Gout   . Iron deficiency anemia   . Vitamin D deficiency   . End stage renal disease (HCC)   . Hypothyroidism    Past Surgical History:  Past Surgical History  Procedure Laterality Date  . Dialysis fistula creation Left    HPI:  Pt is a 81 y.o. female with a known history of end-stage renal disease, anemia of chronic disease, hypothyroidism, hypertension who presents to the hospital from hemodialysis center when she was found to be disoriented and somewhat agitated and did not know where she was. Unfortunately, family does not know much what happened and patient herself is not able to provide any review of systems, she keeps repeating that she does not know what happened to her and has difficulty expressing herself. Her systolic blood pressure was found to be elevated at around 200s despite full session of hemodialysis earlier today and receiving losartan in emergency room. MRI revealed Acute infarct in the left parietal and high occipital cortex. Pt is verbally conversive w/ mild+, inconsistent expressive lanague deficits c/b word finding delay, phonemic paraphasia - appeared more prominent w/ asked confrontational questions. Given min. semantic cue support, pt was able to recall the word she wanted to use. Pt followed all commands w/ accuracy. Orientation was adequate to self, family and place/time(word finding deficit w/ "16" but completed 2016 w/ phonemic cue). She conversed more easily in conversation of familiar topics.   Assessment / Plan / Recommendation Clinical Impression  Pt appeared to adequately and safely tolerate  trials of thin liquids and purees/soft solids w/ no overt s/s of aspiration noted; no oral phase deficits noted w/ soft foods. Pt fed self w/ min. setup. She followed general aspiration precautions as educated on. Pt would benefit from softened foods sec. to lacking some dentition. Rec. aspiration precautions; meds w/ puree if any difficulty swallowing w/ liquids. Rec. ST f/u for language evaluation to further address any expressive deficits affecting communication in ADLs, if not resolved as pt d/c's home. Pt agreed. (educated pt on taking her time in conversation and using semantic cues to recall a word/phrase)    Aspiration Risk   (reduced)    Diet Recommendation  mech soft consistency diet (cut meats, moistened foods); thin liquids; aspiration precautions  Medication Administration: Whole meds with liquid (or w/ puree as Arther Damesnec.)    Other  Recommendations Oral Care Recommendations: Oral care BID;Patient independent with oral care (w/ setup)   Follow up Recommendations  Home health SLP (for further assessment/tx of language/communication skills)    Frequency and Duration            Prognosis Prognosis for Safe Diet Advancement: Good Barriers to Reach Goals:  (pt is HOH)      Swallow Study   General Date of Onset: 02/10/15 HPI: Pt is a 81 y.o. female with a known history of end-stage renal disease, anemia of chronic disease, hypothyroidism, hypertension who presents to the hospital from hemodialysis center when she was found to be disoriented and somewhat agitated and did not know where she was. Unfortunately, family does not know much what happened and patient herself is not able to  provide any review of systems, she keeps repeating that she does not know what happened to her and has difficulty expressing herself. Her systolic blood pressure was found to be elevated at around 200s despite full session of hemodialysis earlier today and receiving losartan in emergency room. MRI revealed Acute  infarct in the left parietal and high occipital cortex. Pt is verbally conversive w/ mild+, inconsistent expressive lanague deficits c/b word finding delay, phonemic paraphasia - appeared more prominent w/ asked confrontational questions. Given min. semantic cue support, pt was able to recall the word she wanted to use. Pt followed all commands w/ accuracy. Orientation was adequate to self, family and place/time(word finding deficit w/ "16" but completed 2016 w/ phonemic cue). She conversed more easily in conversation of familiar topics. Type of Study: Bedside Swallow Evaluation Previous Swallow Assessment: none - pt denied any trouble swallowing at home Diet Prior to this Study: Regular;Thin liquids Temperature Spikes Noted: No (wbc 7.8) Respiratory Status: Room air History of Recent Intubation: No Behavior/Cognition: Alert;Cooperative;Pleasant mood Oral Cavity Assessment: Within Functional Limits Oral Care Completed by SLP: No Oral Cavity - Dentition: Missing dentition (top front) Vision: Functional for self-feeding Self-Feeding Abilities: Able to feed self Patient Positioning: Upright in bed Baseline Vocal Quality: Normal Volitional Cough: Strong Volitional Swallow: Able to elicit    Oral/Motor/Sensory Function Overall Oral Motor/Sensory Function: Within functional limits   Ice Chips Ice chips: Within functional limits Presentation: Self Fed;Spoon Other Comments: 5 trials   Thin Liquid Thin Liquid: Within functional limits Presentation: Cup;Straw;Self Fed Other Comments: 4 ozs    Nectar Thick Nectar Thick Liquid: Not tested   Honey Thick Honey Thick Liquid: Not tested   Puree Puree: Within functional limits Presentation: Self Fed;Spoon Other Comments: 4 ozs   Solid Solid: Within functional limits Presentation: Self Fed Other Comments: 6 trials      Jerilynn Som, MS, CCC-SLP  Yvonne Roy 02/11/2015,12:10 PM

## 2015-02-12 DIAGNOSIS — I639 Cerebral infarction, unspecified: Secondary | ICD-10-CM | POA: Diagnosis present

## 2015-02-12 LAB — CBC
HEMATOCRIT: 32.7 % — AB (ref 35.0–47.0)
HEMOGLOBIN: 10.6 g/dL — AB (ref 12.0–16.0)
MCH: 29.7 pg (ref 26.0–34.0)
MCHC: 32.5 g/dL (ref 32.0–36.0)
MCV: 91.2 fL (ref 80.0–100.0)
Platelets: 139 10*3/uL — ABNORMAL LOW (ref 150–440)
RBC: 3.59 MIL/uL — ABNORMAL LOW (ref 3.80–5.20)
RDW: 15.3 % — AB (ref 11.5–14.5)
WBC: 6.8 10*3/uL (ref 3.6–11.0)

## 2015-02-12 LAB — RENAL FUNCTION PANEL
ANION GAP: 9 (ref 5–15)
Albumin: 3.1 g/dL — ABNORMAL LOW (ref 3.5–5.0)
BUN: 56 mg/dL — AB (ref 6–20)
CHLORIDE: 101 mmol/L (ref 101–111)
CO2: 25 mmol/L (ref 22–32)
Calcium: 8.7 mg/dL — ABNORMAL LOW (ref 8.9–10.3)
Creatinine, Ser: 5.9 mg/dL — ABNORMAL HIGH (ref 0.44–1.00)
GFR calc Af Amer: 7 mL/min — ABNORMAL LOW (ref >60)
GFR calc non Af Amer: 6 mL/min — ABNORMAL LOW (ref >60)
GLUCOSE: 104 mg/dL — AB (ref 65–99)
PHOSPHORUS: 3.7 mg/dL (ref 2.5–4.6)
POTASSIUM: 4.2 mmol/L (ref 3.5–5.1)
Sodium: 135 mmol/L (ref 135–145)

## 2015-02-12 LAB — PHOSPHORUS: Phosphorus: 4.7 mg/dL — ABNORMAL HIGH (ref 2.5–4.6)

## 2015-02-12 LAB — LIPID PANEL
Cholesterol: 124 mg/dL (ref 0–200)
HDL: 37 mg/dL — ABNORMAL LOW (ref >40)
LDL CALC: 78 mg/dL (ref 0–99)
TRIGLYCERIDES: 47 mg/dL (ref <150)
Total CHOL/HDL Ratio: 3.4 RATIO
VLDL: 9 mg/dL (ref 0–40)

## 2015-02-12 MED ORDER — PENTAFLUOROPROP-TETRAFLUOROETH EX AERO
1.0000 "application " | INHALATION_SPRAY | CUTANEOUS | Status: DC | PRN
  Filled 2015-02-12: qty 30

## 2015-02-12 MED ORDER — NEPRO/CARBSTEADY PO LIQD
237.0000 mL | ORAL | Status: DC
  Administered 2015-02-12: 237 mL via ORAL

## 2015-02-12 MED ORDER — HEPARIN SODIUM (PORCINE) 1000 UNIT/ML DIALYSIS
1000.0000 [IU] | INTRAMUSCULAR | Status: DC | PRN
  Filled 2015-02-12: qty 1

## 2015-02-12 MED ORDER — SODIUM CHLORIDE 0.9 % IV SOLN: 100.0000 mL | INTRAVENOUS | Status: DC | PRN

## 2015-02-12 MED ORDER — ALTEPLASE 2 MG IJ SOLR
2.0000 mg | Freq: Once | INTRAMUSCULAR | Status: DC | PRN
  Filled 2015-02-12: qty 2

## 2015-02-12 MED ORDER — LIDOCAINE-PRILOCAINE 2.5-2.5 % EX CREA
1.0000 "application " | TOPICAL_CREAM | CUTANEOUS | Status: DC | PRN
  Filled 2015-02-12: qty 5

## 2015-02-12 MED ORDER — LIDOCAINE HCL (PF) 1 % IJ SOLN
5.0000 mL | INTRAMUSCULAR | Status: DC | PRN
  Filled 2015-02-12: qty 5

## 2015-02-12 NOTE — Discharge Instructions (Signed)
°  DIET:  Cardiac diet, renal diet  DISCHARGE CONDITION:  Fair  ACTIVITY:  Activity as tolerated  OXYGEN:  Home Oxygen: No.   Oxygen Delivery: room air  DISCHARGE LOCATION:  home with home health  If you experience worsening of your admission symptoms, develop shortness of breath, life threatening emergency, suicidal or homicidal thoughts you must seek medical attention immediately by calling 911 or calling your MD immediately  if symptoms less severe.  You Must read complete instructions/literature along with all the possible adverse reactions/side effects for all the Medicines you take and that have been prescribed to you. Take any new Medicines after you have completely understood and accpet all the possible adverse reactions/side effects.   Please note  You were cared for by a hospitalist during your hospital stay. If you have any questions about your discharge medications or the care you received while you were in the hospital after you are discharged, you can call the unit and asked to speak with the hospitalist on call if the hospitalist that took care of you is not available. Once you are discharged, your primary care physician will handle any further medical issues. Please note that NO REFILLS for any discharge medications will be authorized once you are discharged, as it is imperative that you return to your primary care physician (or establish a relationship with a primary care physician if you do not have one) for your aftercare needs so that they can reassess your need for medications and monitor your lab values.

## 2015-02-12 NOTE — Plan of Care (Signed)
Problem: Tissue Perfusion: Goal: Complications of Ischemic Stroke will be minimized. Outcome: Progressing Pt speech has improved. Pt denies pain. Pt has been resting comfortably during shift. Pt has been bradycardic most of shift. No other signs of distress noted. Will continue to monitor.

## 2015-02-12 NOTE — Progress Notes (Signed)
[facility] - [place] at [facility]   PATIENT NAME: Yvonne Roy    MR#:  [id number]  DATE OF BIRTH:  08-14-1922  SUBJECTIVE:   Doing well this morning. No further neurologic changes. No aphasia.  REVIEW OF SYSTEMS:    Review of Systems  Constitutional: Negative for fever, chills and malaise/fatigue.  HENT: Negative for sore throat.   Eyes: Negative for blurred vision.  Respiratory: Negative for cough, hemoptysis, shortness of breath and wheezing.   Cardiovascular: Negative for chest pain, palpitations and leg swelling.  Gastrointestinal: Negative for nausea, vomiting, abdominal pain, diarrhea and blood in stool.  Genitourinary: Negative for dysuria.  Musculoskeletal: Negative for back pain.  Neurological: Positive for speech change. Negative for dizziness, tremors and headaches.  Endo/Heme/Allergies: Does not bruise/bleed easily.    Tolerating Diet: Yes  DRUG ALLERGIES:   Allergies  Allergen Reactions  . Clonidine Derivatives     VITALS:  Blood pressure 148/119, pulse 55, temperature 97.5 F (36.4 C), temperature source Oral, resp. rate 12, height  (1.575 m), weight 48 kg (105 lb 13.1 oz), SpO2 99 %.  PHYSICAL EXAMINATION:   Physical Exam  Constitutional: She is oriented to person, place, and time and well-developed, well-nourished, and in no distress. No distress.  HENT:  Head: Normocephalic.  Eyes: No scleral icterus.  Neck: Normal range of motion. Neck supple. No JVD present. No tracheal deviation present.  Cardiovascular: Normal rate, regular rhythm and normal heart sounds.  Exam reveals no gallop and no friction rub.   No murmur heard. Pulmonary/Chest: Effort normal and breath sounds normal. No respiratory distress. She has no wheezes. She has no rales. She exhibits no tenderness.  Abdominal: Soft. Bowel sounds are normal. She exhibits no distension and no mass. There is no tenderness. There is no rebound and no guarding.   Musculoskeletal: Normal range of motion. She exhibits no edema.  Neurological: She is alert and oriented to person, place, and time.  Skin: Skin is warm. No rash noted. No erythema.  Psychiatric: Affect and judgment normal.   LABORATORY PANEL:   CBC  Recent Labs Lab 02/12/15 1221  WBC 6.8  HGB 10.6*  HCT 32.7*  PLT 139*   ------------------------------------------------------------------------------------------------------------------  Chemistries   Recent Labs Lab 02/10/15 1258  02/12/15 1221  NA 136  < > 135  K 4.1  < > 4.2  CL 98*  < > 101  CO2 28  < > 25  GLUCOSE 133*  < > 104*  BUN 37*  < > 56*  CREATININE 4.88*  < > 5.90*  CALCIUM 9.2  < > 8.7*  AST 19  --   --   ALT 11*  --   --   ALKPHOS 79  --   --   BILITOT 0.8  --   --   < > = values in this interval not displayed. ------------------------------------------------------------------------------------------------------------------  Cardiac Enzymes  Recent Labs Lab 02/10/15 2031 02/10/15 2356 02/11/15 1125  TROPONINI 0.06* 0.07* 0.06*   ------------------------------------------------------------------------------------------------------------------  RADIOLOGY:  Mr Brain Wo Contrast  02/10/2015  CLINICAL DATA:  Expressive aphasia. EXAM: MRI HEAD WITHOUT CONTRAST TECHNIQUE: Multiplanar, multiecho pulse sequences of the brain and surrounding structures were obtained without intravenous contrast. COMPARISON:  Head CT from earlier today FINDINGS: Calvarium and upper cervical spine: Diffusely hypo intense marrow signal, likely from the patient's end-stage renal disease. The bones do appear sclerotic on prior head CT. Orbits: No significant findings. Sinuses and Mastoids: Clear. Brain: There is cortical restricted  diffusion in the left parietal and high occipital lobe, approximately 3 cm area. Appearance is consistent with an acute infarct, along the posterior border zone territory. There has been previous  medium size cortical and subcortical infarcts in the high left frontal and right frontal parietal cortex. Chronic subcortical small vessel infarcts with lacunes in the left putamen, right thalamus, and right more than left cerebellum. No evidence of major vessel occlusion. No indication of venous occlusion around the left parietal convexity. No hydrocephalus, acute hemorrhage, or shift. IMPRESSION: 1. Acute infarct in the left parietal and high occipital cortex, nonhemorrhagic. 2. Remote cortical and subcortical infarcts as described above. 3. Sclerotic bones, history suggesting renal osteodystrophy. Electronically Signed   By: Marnee SpringJonathon  Watts M.D.   On: 02/10/2015 16:46   [place] Carotid Bilateral  02/11/2015  CLINICAL DATA:  Stroke EXAM: BILATERAL CAROTID DUPLEX ULTRASOUND TECHNIQUE: Wallace CullensGray scale imaging, color Doppler and duplex ultrasound were performed of bilateral carotid and vertebral arteries in the neck. COMPARISON:  03/24/2010 FINDINGS: Criteria: Quantification of carotid stenosis is based on velocity parameters that correlate the residual internal carotid diameter with NASCET-based stenosis levels, using the diameter of the distal internal carotid lumen as the denominator for stenosis measurement. The following velocity measurements were obtained: RIGHT ICA:  83 cm/sec CCA:  64 cm/sec SYSTOLIC ICA/CCA RATIO:  1.3 DIASTOLIC ICA/CCA RATIO:  2.7 ECA:  74 cm/sec LEFT ICA:  212 cm/sec CCA:  74 cm/sec SYSTOLIC ICA/CCA RATIO:  2.9 DIASTOLIC ICA/CCA RATIO:  0 ECA:  224 cm/sec RIGHT CAROTID ARTERY: Mild calcified plaque in the upper common carotid. Moderate mixed plaque in the bulb. Low resistance internal carotid Doppler pattern is preserved. Rhythm is irregular. RIGHT VERTEBRAL ARTERY:  Antegrade with a normal Doppler pattern. LEFT CAROTID ARTERY: There is moderate irregular plaque in the bulb. Calcified plaque does shadow the vessel lumen. There is very little diastolic flow in portions of the internal carotid.  LEFT VERTEBRAL ARTERY: Antegrade. Doppler pattern is abnormal with very poor diastolic flow. IMPRESSION: Less than 50% stenosis in the right internal carotid artery. Normal antegrade flow in the right vertebral artery. 50-69% stenosis in the left internal carotid artery. Abnormal antegrade Doppler flow pattern in the left vertebral artery as described. Significant narrowing cannot be excluded. Electronically Signed   By: Jolaine ClickArthur  Hoss M.D.   On: 02/11/2015 12:12     ASSESSMENT AND PLAN:   This is a 81 year old female with end-stage renal disease on hemodialysis who presented with malignant hypertension expressive aphasia and now found to have an acute stroke.  1. Acute infarct in the left parietal and high occipital cortex, nonhemorrhagic:  - Continue aspirin, Plavix, statin - Physical therapy consultation is for home health physical therapy and 24 hour assistance - Carotid ultrasound shows 50-69% blockage of the left ICA, Dr. Tildon HuskyModi discussed with the family and no intervention is planned - Echo is normal. No vegetations. Ejection fraction 60-69%. - Neurology consultation appreciated  2. Essential hypertension: Due to problem #1 patient needs to allow brain perfusion. Hydralazine when necessary has been ordered with parameters.  3. End-stage renal disease and Hemoccults: Patient receiving dialysis today  4. Hypothyroidism: Continue Synthroid  Management plans discussed with the patient and daughter and she is in agreement.  CODE STATUS: Full  TOTAL TIME TAKING CARE OF THIS PATIENT: 30 minutes.     POSSIBLE D/C tomorrow, DEPENDING ON CLINICAL CONDITION.   Elby ShowersWALSH, CATHERINE M.D on 02/12/2015 at 3:35 PM  Between 7am to 6pm - Pager - [phone] After 6pm  go to [email or web address] - password [email or web address] [facility]  [facility]  Office  [phone]  CC: Primary care physician; No primary care provider on file.  Note: This dictation was prepared with Dragon dictation along with  smaller phrase technology. Any transcriptional errors that result from this process are unintentional.

## 2015-02-12 NOTE — Discharge Summary (Signed)
[facility] [place] at [facility]  DISCHARGE SUMMARY   PATIENT NAME: Yvonne Roy    MR#:  [id number]  DATE OF BIRTH:  [birth year]  DATE OF ADMISSION:  02/10/2015 ADMITTING PHYSICIAN: Katharina Caper, MD  DATE OF DISCHARGE: 02/12/2015  PRIMARY CARE PHYSICIAN: No primary care provider on file.    ADMISSION DIAGNOSIS:  CVA (cerebral infarction) [I63.9] Elevated troponin [R79.89] Expressive aphasia [R47.01] Hypertensive urgency [I16.0]  DISCHARGE DIAGNOSIS:  Principal Problem:   Expressive aphasia Active Problems:   Malignant essential hypertension   Altered mental status   End stage renal disease (HCC)   Elevated troponin   CVA (cerebral vascular accident) (HCC)   SECONDARY DIAGNOSIS:   Past Medical History  Diagnosis Date  . Anemia in chronic kidney disease   . Gout   . Iron deficiency anemia   . Vitamin D deficiency   . End stage renal disease (HCC)   . Hypothyroidism     HOSPITAL COURSE:    This is a 81 year old female with end-stage renal disease on hemodialysis who presented with malignant hypertension expressive aphasia and now found to have an acute stroke.  1. Acute infarct in the left parietal and high occipital cortex, nonhemorrhagic:  - Continue aspirin, Plavix, statin - Physical therapy consultation is for home health physical therapy and 24 hour assistance - Carotid ultrasound shows 50-69% blockage of the left ICA, Dr. Juliene Pina discussed with the family and no intervention is planned - Echo is normal. No vegetations. Ejection fraction 60-69%. - Neurology consultation appreciated - I have discussed care plan with the family. They are able to provide 24-hour assistance. Have also discussed that she will need assistance with feeding due to confusion. She will need home health physical therapy and home health nursing.  2. Essential hypertension: Blood pressure well controlled at this time. Allow for some permissive hypertension in  the setting of recent stroke.   3. End-stage renal disease and Hemoccults: Patient receiving dialysis today and then will continue with her regular Tuesday Thursday Saturday schedule as an outpatient.  4. Hypothyroidism: Continue Synthroid  DISCHARGE CONDITIONS:   Stable  CONSULTS OBTAINED:  Treatment Team:  Mady Haagensen, MD Pauletta Browns, MD Dalia Heading, MD  DRUG ALLERGIES:   Allergies  Allergen Reactions  . Clonidine Derivatives     DISCHARGE MEDICATIONS:   Current Discharge Medication List    START taking these medications   Details  atorvastatin (LIPITOR) 20 MG tablet Take 1 tablet (20 mg total) by mouth daily. Qty: 30 tablet, Refills: 0      CONTINUE these medications which have NOT CHANGED   Details  allopurinol (ZYLOPRIM) 100 MG tablet Take 1 tablet by mouth daily. Refills: 11    aspirin EC 81 MG tablet Take 1 tablet by mouth daily.    calcium acetate (PHOSLO) 667 MG capsule Take 1,334 mg by mouth 3 (three) times daily with meals.    cetirizine (ZYRTEC) 10 MG tablet Take 1 tablet by mouth daily. Refills: 5    clopidogrel (PLAVIX) 75 MG tablet Take 75 mg by mouth daily. Refills: 11    furosemide (LASIX) 80 MG tablet Take 1 tablet by mouth daily. Refills: 10    isosorbide mononitrate (IMDUR) 30 MG 24 hr tablet Take 1 tablet by mouth daily. Refills: 11    lidocaine-prilocaine (EMLA) cream APPLY TO SKIN 3 TIMES A WEEK AS DIRECTED TO ACESS SITE 30 MINUTES BEFORE TREATMENT Refills: 3    losartan (COZAAR) 50 MG tablet Take 50  mg by mouth daily. Refills: 11    ranitidine (ZANTAC) 150 MG tablet Take 150 mg by mouth 2 (two) times daily. Refills: 3    SYNTHROID 100 MCG tablet Take 100 mcg by mouth daily. Refills: 6      STOP taking these medications     metoprolol (LOPRESSOR) 50 MG tablet          DISCHARGE INSTRUCTIONS:    Stable condition. Home health nursing and physical therapy. Cardiac and renal diet.  If you experience worsening  of your admission symptoms, develop shortness of breath, life threatening emergency, suicidal or homicidal thoughts you must seek medical attention immediately by calling 911 or calling your MD immediately  if symptoms less severe.  You Must read complete instructions/literature along with all the possible adverse reactions/side effects for all the Medicines you take and that have been prescribed to you. Take any new Medicines after you have completely understood and accept all the possible adverse reactions/side effects.   Please note  You were cared for by a hospitalist during your hospital stay. If you have any questions about your discharge medications or the care you received while you were in the hospital after you are discharged, you can call the unit and asked to speak with the hospitalist on call if the hospitalist that took care of you is not available. Once you are discharged, your primary care physician will handle any further medical issues. Please note that NO REFILLS for any discharge medications will be authorized once you are discharged, as it is imperative that you return to your primary care physician (or establish a relationship with a primary care physician if you do not have one) for your aftercare needs so that they can reassess your need for medications and monitor your lab values.    Today   CHIEF COMPLAINT:   Chief Complaint  Patient presents with  . Altered Mental Status    HISTORY OF PRESENT ILLNESS:   Yvonne GulaJosie Roy is a 81 y.o. female with a known history of end-stage renal disease, anemia of chronic disease, hypothyroidism, hypertension who presents to the hospital from hemodialysis center when she was found to be disoriented and somewhat agitated and did not know where she was. Unfortunately, family does not know much what happened and patient herself is not able to provide any review of systems, she keeps repeating that she does not know what happened to her and  has difficulty expressing herself. Her systolic blood pressure was found to be elevated at around 200s despite full session of hemodialysis earlier today and receiving losartan in emergency room . CT scan of head was unremarkable . Hospitalist services were contacted for admission   VITAL SIGNS:  Blood pressure 109/89, pulse 70, temperature 97.5 F (36.4 C), temperature source Oral, resp. rate 14, height 5\' 2"  (1.575 m), weight 48 kg (105 lb 13.1 oz), SpO2 99 %.  I/O:  No intake or output data in the 24 hours ending 02/12/15 1552  PHYSICAL EXAMINATION:  Constitutional: She is oriented to person, place, and time and well-developed, well-nourished, and in no distress. No distress.  HENT:  Head: Normocephalic.  Eyes: No scleral icterus.  Neck: Normal range of motion. Neck supple. No JVD present. No tracheal deviation present.  Cardiovascular: Normal rate, regular rhythm and normal heart sounds. Exam reveals no gallop and no friction rub.  No murmur heard. Pulmonary/Chest: Effort normal and breath sounds normal. No respiratory distress. She has no wheezes. She has no rales. She  exhibits no tenderness.  Abdominal: Soft. Bowel sounds are normal. She exhibits no distension and no mass. There is no tenderness. There is no rebound and no guarding.  Musculoskeletal: Normal range of motion. She exhibits no edema.  Neurological: She is alert and oriented to person, place, and time.  Skin: Skin is warm. No rash noted. No erythema.  Psychiatric: Affect and judgment normal.   DATA REVIEW:   CBC  Recent Labs Lab 02/12/15 1221  WBC 6.8  HGB 10.6*  HCT 32.7*  PLT 139*    Chemistries   Recent Labs Lab 02/10/15 1258  02/12/15 1221  NA 136  < > 135  K 4.1  < > 4.2  CL 98*  < > 101  CO2 28  < > 25  GLUCOSE 133*  < > 104*  BUN 37*  < > 56*  CREATININE 4.88*  < > 5.90*  CALCIUM 9.2  < > 8.7*  AST 19  --   --   ALT 11*  --   --   ALKPHOS 79  --   --   BILITOT 0.8  --   --   < > =  values in this interval not displayed.  Cardiac Enzymes  Recent Labs Lab 02/11/15 1125  TROPONINI 0.06*    Microbiology Results  Results for orders placed or performed during the hospital encounter of 02/10/15  MRSA PCR Screening     Status: None   Collection Time: 02/10/15  6:10 PM  Result Value Ref Range Status   MRSA by PCR NEGATIVE NEGATIVE Final    Comment:        The GeneXpert MRSA Assay (FDA approved for NASAL specimens only), is one component of a comprehensive MRSA colonization surveillance program. It is not intended to diagnose MRSA infection nor to guide or monitor treatment for MRSA infections.     RADIOLOGY:  Mr Sherrin Daisy Contrast  02/10/2015  CLINICAL DATA:  Expressive aphasia. EXAM: MRI HEAD WITHOUT CONTRAST TECHNIQUE: Multiplanar, multiecho pulse sequences of the brain and surrounding structures were obtained without intravenous contrast. COMPARISON:  Head CT from earlier today FINDINGS: Calvarium and upper cervical spine: Diffusely hypo intense marrow signal, likely from the patient's end-stage renal disease. The bones do appear sclerotic on prior head CT. Orbits: No significant findings. Sinuses and Mastoids: Clear. Brain: There is cortical restricted diffusion in the left parietal and high occipital lobe, approximately 3 cm area. Appearance is consistent with an acute infarct, along the posterior border zone territory. There has been previous medium size cortical and subcortical infarcts in the high left frontal and right frontal parietal cortex. Chronic subcortical small vessel infarcts with lacunes in the left putamen, right thalamus, and right more than left cerebellum. No evidence of major vessel occlusion. No indication of venous occlusion around the left parietal convexity. No hydrocephalus, acute hemorrhage, or shift. IMPRESSION: 1. Acute infarct in the left parietal and high occipital cortex, nonhemorrhagic. 2. Remote cortical and subcortical infarcts as  described above. 3. Sclerotic bones, history suggesting renal osteodystrophy. Electronically Signed   By: Marnee Spring M.D.   On: 02/10/2015 16:46   US Carotid Bilateral  02/11/2015  CLINICAL DATA:  Stroke EXAM: BILATERAL CAROTID DUPLEX ULTRASOUND TECHNIQUE: Wallace Cullens scale imaging, color Doppler and duplex ultrasound were performed of bilateral carotid and vertebral arteries in the neck. COMPARISON:  03/24/2010 FINDINGS: Criteria: Quantification of carotid stenosis is based on velocity parameters that correlate the residual internal carotid diameter with NASCET-based stenosis levels, using the diameter  of the distal internal carotid lumen as the denominator for stenosis measurement. The following velocity measurements were obtained: RIGHT ICA:  83 cm/sec CCA:  64 cm/sec SYSTOLIC ICA/CCA RATIO:  1.3 DIASTOLIC ICA/CCA RATIO:  2.7 ECA:  74 cm/sec LEFT ICA:  212 cm/sec CCA:  74 cm/sec SYSTOLIC ICA/CCA RATIO:  2.9 DIASTOLIC ICA/CCA RATIO:  0 ECA:  224 cm/sec RIGHT CAROTID ARTERY: Mild calcified plaque in the upper common carotid. Moderate mixed plaque in the bulb. Low resistance internal carotid Doppler pattern is preserved. Rhythm is irregular. RIGHT VERTEBRAL ARTERY:  Antegrade with a normal Doppler pattern. LEFT CAROTID ARTERY: There is moderate irregular plaque in the bulb. Calcified plaque does shadow the vessel lumen. There is very little diastolic flow in portions of the internal carotid. LEFT VERTEBRAL ARTERY: Antegrade. Doppler pattern is abnormal with very poor diastolic flow. IMPRESSION: Less than 50% stenosis in the right internal carotid artery. Normal antegrade flow in the right vertebral artery. 50-69% stenosis in the left internal carotid artery. Abnormal antegrade Doppler flow pattern in the left vertebral artery as described. Significant narrowing cannot be excluded. Electronically Signed   By: Jolaine Click M.D.   On: 02/11/2015 12:12    EKG:   Orders placed or performed during the hospital  encounter of 02/10/15  . EKG 12-Lead  . EKG 12-Lead  . EKG 12-Lead  . EKG 12-Lead      Management plans discussed with the patient, family and they are in agreement.  CODE STATUS:     Code Status Orders        Start     Ordered   02/10/15 1807  Full code   Continuous     02/10/15 1806      TOTAL TIME TAKING CARE OF THIS PATIENT: 35 minutes.  Greater than 50% of time spent in care coordination and counseling.  Elby Showers M.D on 02/12/2015 at 3:52 PM  Between 7am to 6pm - Pager - [phone]  After 6pm go to [email or web address] - password [email or web address] [facility]  [facility]  Office  [phone]  CC: Primary care physician; No primary care provider on file.

## 2015-02-12 NOTE — Progress Notes (Signed)
[facility]  ROUNDING NOTE   Subjective:  Patient seen and evaluated during dialysis. Less confusion today. Patient had acute infarct in the left parietal and high occipital cortex which was nonhemorrhagic.   Objective:  Vital signs in last 24 hours:  Temp:  [97.5 F (36.4 C)-98.7 F (37.1 C)] 97.5 F (36.4 C) (12/03 1215) Pulse Rate:  [51-61] 52 (12/03 1230) Resp:  [13-20] 13 (12/03 1230) BP: (138-181)/(49-93) 163/50 mmHg (12/03 1230) SpO2:  [93 %-100 %] 99 % (12/03 1215) Weight:  [48 kg (105 lb 13.1 oz)] 48 kg (105 lb 13.1 oz) (12/03 1215)  Weight change:  Filed Weights   02/10/15 1250 02/12/15 1215  Weight: 49.896 kg (110 lb) 48 kg (105 lb 13.1 oz)    Intake/Output: I/O last 3 completed shifts: In: 3 [I.V.:3] Out: -    Intake/Output this shift:     Physical Exam: General: NAD, resting in bed  Head: Normocephalic, atraumatic. Moist oral mucosal membranes  Eyes: Anicteric  Neck: Supple, trachea midline  Lungs:  Clear to auscultation  Heart: S1S2 no rubs  Abdomen:  Soft, nontender, BS present  Extremities: No peripheral edema.  Neurologic:  alert, follows commands with all 4 extremities, still a bit confused.   Skin: No lesions  Access: LUE AVF    Basic Metabolic Panel:  Recent Labs Lab 02/10/15 1258 02/11/15 0719 02/12/15 1115  NA 136 136  --   K 4.1 4.6  --   CL 98* 100*  --   CO2 28 26  --   GLUCOSE 133* 76  --   BUN 37* 47*  --   CREATININE 4.88* 6.23*  --   CALCIUM 9.2 8.7*  --   PHOS  --   --  4.7*    Liver Function Tests:  Recent Labs Lab 02/10/15 1258  AST 19  ALT 11*  ALKPHOS 79  BILITOT 0.8  PROT 8.5*  ALBUMIN 3.7   No results for input(s): LIPASE, AMYLASE in the last 168 hours. No results for input(s): AMMONIA in the last 168 hours.  CBC:  Recent Labs Lab 02/10/15 1258 02/11/15 0719  WBC 10.4 7.8  NEUTROABS 6.1  --   HGB 12.1 11.2*  HCT 37.7 34.2*  MCV 92.0 91.7  PLT 189 154    Cardiac  Enzymes:  Recent Labs Lab 02/10/15 1258 02/10/15 1648 02/10/15 2031 02/10/15 2356 02/11/15 1125  TROPONINI 0.05* 0.05* 0.06* 0.07* 0.06*    BNP: Invalid input(s): POCBNP  CBG:  Recent Labs Lab 02/10/15 1808  GLUCAP 132*    Microbiology: Results for orders placed or performed during the hospital encounter of 02/10/15  MRSA PCR Screening     Status: None   Collection Time: 02/10/15  6:10 PM  Result Value Ref Range Status   MRSA by PCR NEGATIVE NEGATIVE Final    Comment:        The GeneXpert MRSA Assay (FDA approved for NASAL specimens only), is one component of a comprehensive MRSA colonization surveillance program. It is not intended to diagnose MRSA infection nor to guide or monitor treatment for MRSA infections.     Coagulation Studies: No results for input(s): LABPROT, INR in the last 72 hours.  Urinalysis: No results for input(s): COLORURINE, LABSPEC, PHURINE, GLUCOSEU, HGBUR, BILIRUBINUR, KETONESUR, PROTEINUR, UROBILINOGEN, NITRITE, LEUKOCYTESUR in the last 72 hours.  Invalid input(s): APPERANCEUR    Imaging: Dg Chest 2 View  02/10/2015  CLINICAL DATA:  Altered mental status after routine dialysis EXAM: CHEST  2 VIEW  COMPARISON:  11/24/2013 FINDINGS: Cardiomediastinal silhouette is stable. No acute infiltrate or pleural effusion. No pulmonary edema. Atherosclerotic calcifications of thoracic aorta. Central mild vascular congestion IMPRESSION: Cardiomegaly. Central mild vascular congestion without pulmonary edema. Atherosclerotic calcifications of thoracic aorta. No segmental infiltrate. Electronically Signed   By: Natasha Mead M.D.   On: 02/10/2015 14:24   Ct Head Wo Contrast  02/10/2015  CLINICAL DATA:  Confusion and altered mental status. Dialysis patient EXAM: CT HEAD WITHOUT CONTRAST TECHNIQUE: Contiguous axial images were obtained from the base of the skull through the vertex without intravenous contrast. COMPARISON:  CT 05/03/2012 FINDINGS: Moderate to  advanced atrophy. Chronic lacunar infarction left basal ganglia unchanged. Chronic infarct right frontal lobe has developed since prior study. Negative for acute infarct.  Negative for hemorrhage or mass. Negative for skull lesion. IMPRESSION: Atrophy and chronic ischemia.  No acute abnormality. Electronically Signed   By: Marlan Palau M.D.   On: 02/10/2015 14:30   Mr Brain Wo Contrast  02/10/2015  CLINICAL DATA:  Expressive aphasia. EXAM: MRI HEAD WITHOUT CONTRAST TECHNIQUE: Multiplanar, multiecho pulse sequences of the brain and surrounding structures were obtained without intravenous contrast. COMPARISON:  Head CT from earlier today FINDINGS: Calvarium and upper cervical spine: Diffusely hypo intense marrow signal, likely from the patient's end-stage renal disease. The bones do appear sclerotic on prior head CT. Orbits: No significant findings. Sinuses and Mastoids: Clear. Brain: There is cortical restricted diffusion in the left parietal and high occipital lobe, approximately 3 cm area. Appearance is consistent with an acute infarct, along the posterior border zone territory. There has been previous medium size cortical and subcortical infarcts in the high left frontal and right frontal parietal cortex. Chronic subcortical small vessel infarcts with lacunes in the left putamen, right thalamus, and right more than left cerebellum. No evidence of major vessel occlusion. No indication of venous occlusion around the left parietal convexity. No hydrocephalus, acute hemorrhage, or shift. IMPRESSION: 1. Acute infarct in the left parietal and high occipital cortex, nonhemorrhagic. 2. Remote cortical and subcortical infarcts as described above. 3. Sclerotic bones, history suggesting renal osteodystrophy. Electronically Signed   By: Marnee Spring M.D.   On: 02/10/2015 16:46   US Carotid Bilateral  02/11/2015  CLINICAL DATA:  Stroke EXAM: BILATERAL CAROTID DUPLEX ULTRASOUND TECHNIQUE: Wallace Cullens scale imaging, color  Doppler and duplex ultrasound were performed of bilateral carotid and vertebral arteries in the neck. COMPARISON:  03/24/2010 FINDINGS: Criteria: Quantification of carotid stenosis is based on velocity parameters that correlate the residual internal carotid diameter with NASCET-based stenosis levels, using the diameter of the distal internal carotid lumen as the denominator for stenosis measurement. The following velocity measurements were obtained: RIGHT ICA:  83 cm/sec CCA:  64 cm/sec SYSTOLIC ICA/CCA RATIO:  1.3 DIASTOLIC ICA/CCA RATIO:  2.7 ECA:  74 cm/sec LEFT ICA:  212 cm/sec CCA:  74 cm/sec SYSTOLIC ICA/CCA RATIO:  2.9 DIASTOLIC ICA/CCA RATIO:  0 ECA:  224 cm/sec RIGHT CAROTID ARTERY: Mild calcified plaque in the upper common carotid. Moderate mixed plaque in the bulb. Low resistance internal carotid Doppler pattern is preserved. Rhythm is irregular. RIGHT VERTEBRAL ARTERY:  Antegrade with a normal Doppler pattern. LEFT CAROTID ARTERY: There is moderate irregular plaque in the bulb. Calcified plaque does shadow the vessel lumen. There is very little diastolic flow in portions of the internal carotid. LEFT VERTEBRAL ARTERY: Antegrade. Doppler pattern is abnormal with very poor diastolic flow. IMPRESSION: Less than 50% stenosis in the right internal carotid artery. Normal antegrade flow  in the right vertebral artery. 50-69% stenosis in the left internal carotid artery. Abnormal antegrade Doppler flow pattern in the left vertebral artery as described. Significant narrowing cannot be excluded. Electronically Signed   By: Jolaine Click M.D.   On: 02/11/2015 12:12     Medications:     . allopurinol  100 mg Oral Daily  . aspirin EC  81 mg Oral Daily  . atorvastatin  20 mg Oral q1800  . calcium acetate  1,334 mg Oral TID WC  . clopidogrel  75 mg Oral Daily  . famotidine  10 mg Oral Daily  . feeding supplement (NEPRO CARB STEADY)  237 mL Oral Q24H  . furosemide  80 mg Oral Daily  . heparin  5,000 Units  Subcutaneous 3 times per day  . levothyroxine  100 mcg Oral QAC breakfast  . loratadine  10 mg Oral Daily  . sodium chloride  3 mL Intravenous Q12H  . sodium chloride  3 mL Intravenous Q12H   sodium chloride, sodium chloride, sodium chloride, acetaminophen **OR** acetaminophen, alteplase, heparin, hydrALAZINE, labetalol, lidocaine (PF), lidocaine-prilocaine, lidocaine-prilocaine, LORazepam, ondansetron **OR** ondansetron (ZOFRAN) IV, pentafluoroprop-tetrafluoroeth, sodium chloride  Assessment/ Plan:  81 y.o. female with pmhx of ESRD on HD, anemia of CKD, SHPTH, malignant hypertension, hx of CVA. LUE AVF, hypothyroidism, gout who presented with malignant HTN and confusion during HD.   HD [facility]/Dr. Thedore Mins  1. End-stage renal disease on hemodialysis [facility]:  Patient seen and evaluated during dialysis. Appears to be tolerating well. Continue dialysis, Tuesday, Thursday, Saturday schedule.  2. Anemia of chronic kidney disease.  Hold Epogen given new CVA.  3. Secondary hyperparathyroidism. phosphorus 4.7. Continue PhosLo.  4.  Hypertension. blood pressure currently 163/50. Would check with neurology regarding permissive hypertension.   5. Altered mental status/confusion/Acute CVA. Left parietal and high occipital.  Likely explains the patient's confusion. Neurology has been consulted. They felt that she was not a good candidate for anticoagulations given her comorbidities.    LOS: 2 Miguelina Fore 12/3/201612:40 PM

## 2015-02-12 NOTE — Progress Notes (Signed)
Initial Nutrition Assessment   INTERVENTION:   Meals and Snacks: Cater to patient preferences Medical Food Supplement Therapy: will recommend Nepro Shake po daily, each supplement provides 425 kcal and 19 grams protein Coordination of Care: will recommend accurate weight   NUTRITION DIAGNOSIS:   Inadequate oral intake related to acute illness as evidenced by per patient/family report, meal completion < 50% this am.  GOAL:   Patient will meet greater than or equal to 90% of their needs  MONITOR:    (Energy Intake, Electrolyte and Renal Profile, Anthropometrics, Digestive System)  REASON FOR ASSESSMENT:    (Dialysis Diet Order)    ASSESSMENT:   Pt admitted with AMS and expressive aphasia likely secondary to stroke per MD note. Pt with h/o ESRD on HD usual schedule T, Th, Sa.  Past Medical History  Diagnosis Date  . Anemia in chronic kidney disease   . Gout   . Iron deficiency anemia   . Vitamin D deficiency   . End stage renal disease (HCC)   . Hypothyroidism      Diet Order:  Diet renal with fluid restriction Fluid restriction:: 1200 mL Fluid; Room service appropriate?: Yes; Fluid consistency:: Thin    Current Nutrition: Pt reports eating fruit and some eggs this am, no oatmeal. Pt reports tolerating well, no difficulty swallowing or chewing.  Food/Nutrition-Related History: Pt reports eating 3 meals per day usually although sometimes small meals. Pt reports getting a Protein Bar at dialysis three times a week.   Scheduled Medications:  . allopurinol  100 mg Oral Daily  . aspirin EC  81 mg Oral Daily  . atorvastatin  20 mg Oral q1800  . calcium acetate  1,334 mg Oral TID WC  . clopidogrel  75 mg Oral Daily  . famotidine  10 mg Oral Daily  . feeding supplement (NEPRO CARB STEADY)  237 mL Oral Q24H  . furosemide  80 mg Oral Daily  . heparin  5,000 Units Subcutaneous 3 times per day  . levothyroxine  100 mcg Oral QAC breakfast  . loratadine  10 mg Oral Daily   . sodium chloride  3 mL Intravenous Q12H  . sodium chloride  3 mL Intravenous Q12H    Electrolyte/Renal Profile and Glucose Profile:   Recent Labs Lab 02/10/15 1258 02/11/15 0719  NA 136 136  K 4.1 4.6  CL 98* 100*  CO2 28 26  BUN 37* 47*  CREATININE 4.88* 6.23*  CALCIUM 9.2 8.7*  GLUCOSE 133* 76   Protein Profile:   Recent Labs Lab 02/10/15 1258  ALBUMIN 3.7    Gastrointestinal Profile: Last BM: unknown   Nutrition-Focused Physical Exam Findings: Nutrition-Focused physical exam completed. Findings are no fat depletion, mild-moderate muscle depletion, and no edema.    Weight Change: Pt does not know usual dry weight or weight trend on visit. No trend per CHL.   Skin:  Reviewed, no issues  Height:   Ht Readings from Last 1 Encounters:  02/10/15 5\' 2"  (1.575 m)    Weight:   Wt Readings from Last 1 Encounters:  02/10/15 110 lb (49.896 kg)     BMI:  Body mass index is 20.11 kg/(m^2).  Estimated Nutritional Needs:   Kcal:  BEE: 892kcals, TEE: (IF 1.2-1.4)(AF 1.2) 1256-1465kcals  Protein:  60-75g protein (1.2-1.5g/kg)  Fluid:  UOP+1L  EDUCATION NEEDS:   No education needs identified at this time   MODERATE Care Level  Leda QuailAllyson Anelise Staron, RD, LDN Pager [phone]

## 2015-02-12 NOTE — Progress Notes (Addendum)
Patient had dialysis this afternoon. Pulled off 1.5L. Per dialysis RN, tolerated well. VSS.  Pt discharged home with home health per MD. Discharge instructions given to patient and daughter.  Instructions, activity, diet, plan of care, medicines & prescriptions reviewed with daughter. All questions answered. Daughter verbalized understanding. IVs removed. Patient left via wheelchair with nursing and daughter.

## 2015-02-12 NOTE — Evaluation (Signed)
Physical Therapy Evaluation Patient Details Name: Yvonne Roy MRN: [id number] DOB: 05-13-1922 Today's Date: 02/12/2015   History of Present Illness  81 yo female with new L parietal and occipital cortex stroke was admitted with elevated  troponin, demand ischemia.  Limited vision and some confusion, lives with daughters  Clinical Impression  Pt was seen for assessment of mobility and has a poor tolerance for distance but needs relatively little help. Talked with daughter about outpatient follow up at a limited intensity, which she will consider as pt also has demands of HD.    Follow Up Recommendations Home health PT;Supervision/Assistance - 24 hour    Equipment Recommendations  None recommended by PT    Recommendations for Other Services       Precautions / Restrictions Precautions Precautions: Fall Restrictions Weight Bearing Restrictions: No      Mobility  Bed Mobility Overal bed mobility: Needs Assistance Bed Mobility: Supine to Sit;Sit to Supine     Supine to sit: Min assist Sit to supine: Min assist   General bed mobility comments: mainly help to scoot back and out on bed  Transfers Overall transfer level: Needs assistance Equipment used: Rolling walker (2 wheeled);1 person hand held assist Transfers: Sit to/from UGI CorporationStand;Stand Pivot Transfers Sit to Stand: Min assist Stand pivot transfers: Min guard;Min assist       General transfer comment: reminded her every trial about correct hand placement  Ambulation/Gait Ambulation/Gait assistance: Min guard;Min assist Ambulation Distance (Feet): 35 Feet Assistive device: Rolling walker (2 wheeled) Gait Pattern/deviations: Step-through pattern;Decreased dorsiflexion - right;Decreased dorsiflexion - left;Trunk flexed;Wide base of support Gait velocity: reduced Gait velocity interpretation: Below normal speed for age/gender General Gait Details: Pt is not maneuvering safely on walker, tends to his doorframe and cannot  plan her path well, tries to set walker aside  Stairs            Wheelchair Mobility    Modified Rankin (Stroke Patients Only) Modified Rankin (Stroke Patients Only) Pre-Morbid Rankin Score: Slight disability Modified Rankin: Moderate disability     Balance Overall balance assessment: Needs assistance Sitting-balance support: Feet supported Sitting balance-Leahy Scale: Fair   Postural control: Posterior lean Standing balance support: Bilateral upper extremity supported Standing balance-Leahy Scale: Poor Standing balance comment: needs hands on contact to control walker and balance                             Pertinent Vitals/Pain Pain Assessment: No/denies pain    Home Living Family/patient expects to be discharged to:: Private residence Living Arrangements: Children Available Help at Discharge: Family;Available 24 hours/day (daughters share her care) Type of Home: House Home Access: Stairs to enter Entrance Stairs-Rails: Right;Can reach Technical sales engineerboth;Left Entrance Stairs-Number of Steps: 2 Home Layout: One level Home Equipment: Walker - 2 wheels;Cane - single point Additional Comments: Has been an independent walker    Prior Function Level of Independence: Independent with assistive device(s)               Hand Dominance        Extremity/Trunk Assessment   Upper Extremity Assessment: Overall WFL for tasks assessed           Lower Extremity Assessment: Generalized weakness      Cervical / Trunk Assessment: Kyphotic  Communication   Communication: No difficulties  Cognition Arousal/Alertness: Awake/alert Behavior During Therapy: Impulsive (in maneuvering walker) Overall Cognitive Status: Impaired/Different from baseline Area of Impairment: Problem solving;Safety/judgement;Awareness  Memory: Decreased recall of precautions;Decreased short-term memory   Safety/Judgement: Decreased awareness of safety;Decreased awareness of  deficits Awareness: Intellectual Problem Solving: Slow processing;Requires verbal cues General Comments: reminders not to use walker to strike the doorframe were repetitive    General Comments General comments (skin integrity, edema, etc.): Pt is moving well with assistance, can be managed at home but was unaware she had used brief and was wet when PT arrived.  Not clear if this is her PLOF or not.    Exercises        Assessment/Plan    PT Assessment Patient needs continued PT services  PT Diagnosis Difficulty walking;Generalized weakness   PT Problem List Decreased strength;Decreased range of motion;Decreased activity tolerance;Decreased balance;Decreased mobility;Decreased coordination;Decreased cognition;Decreased knowledge of use of DME;Decreased safety awareness;Cardiopulmonary status limiting activity;Decreased knowledge of precautions  PT Treatment Interventions DME instruction;Gait training;Stair training;Functional mobility training;Therapeutic activities;Therapeutic exercise;Balance training;Neuromuscular re-education;Patient/family education   PT Goals (Current goals can be found in the Care Plan section) Acute Rehab PT Goals Patient Stated Goal: none stated PT Goal Formulation: With patient/family Time For Goal Achievement: 02/26/15 Potential to Achieve Goals: Good    Frequency Min 2X/week   Barriers to discharge Other (comment) (able to walk enough for home but needs 24/7 help) weakness and need for hands on help    Co-evaluation               End of Session Equipment Utilized During Treatment: Gait belt Activity Tolerance: Patient tolerated treatment well;Patient limited by fatigue Patient left: in bed;with call bell/phone within reach;with family/visitor present Nurse Communication: Mobility status         Time: 2956-2130 PT Time Calculation (min) (ACUTE ONLY): 34 min   Charges:   PT Evaluation $Initial PT Evaluation Tier I: 1 Procedure PT  Treatments $Gait Training: 8-22 mins   PT G CodesIvar Roy 01-Mar-2015, 12:31 PM   Yvonne Roy, PT MS Acute Rehab Dept. Number: [facility] [id number] and [facility] [phone]

## 2015-02-12 NOTE — Progress Notes (Signed)
Discussed patient with Dr. Clent RidgesWalsh. Consult cancelled for now. Call if further assistance needed.

## 2015-02-13 LAB — PARATHYROID HORMONE, INTACT (NO CA): PTH: 91 pg/mL — ABNORMAL HIGH (ref 15–65)

## 2015-04-11 ENCOUNTER — Other Ambulatory Visit: Payer: Self-pay | Admitting: Vascular Surgery

## 2015-04-25 ENCOUNTER — Encounter: Payer: Self-pay | Admitting: *Deleted

## 2015-04-25 ENCOUNTER — Ambulatory Visit
Admission: RE | Admit: 2015-04-25 | Discharge: 2015-04-25 | Disposition: A | Discharge status: Home / Self Care | Payer: Medicare Other | Source: Ambulatory Visit | Attending: Vascular Surgery | Admitting: Vascular Surgery

## 2015-04-25 ENCOUNTER — Encounter
Admission: RE | Disposition: A | Discharge status: Home / Self Care | Payer: Self-pay | Source: Ambulatory Visit | Attending: Vascular Surgery

## 2015-04-25 DIAGNOSIS — D631 Anemia in chronic kidney disease: Secondary | ICD-10-CM | POA: Diagnosis not present

## 2015-04-25 DIAGNOSIS — I132 Hypertensive heart and chronic kidney disease with heart failure and with stage 5 chronic kidney disease, or end stage renal disease: Secondary | ICD-10-CM | POA: Diagnosis not present

## 2015-04-25 DIAGNOSIS — L97509 Non-pressure chronic ulcer of other part of unspecified foot with unspecified severity: Secondary | ICD-10-CM | POA: Diagnosis not present

## 2015-04-25 DIAGNOSIS — I708 Atherosclerosis of other arteries: Secondary | ICD-10-CM | POA: Insufficient documentation

## 2015-04-25 DIAGNOSIS — T82858A Stenosis of vascular prosthetic devices, implants and grafts, initial encounter: Secondary | ICD-10-CM | POA: Diagnosis not present

## 2015-04-25 DIAGNOSIS — N186 End stage renal disease: Secondary | ICD-10-CM | POA: Diagnosis not present

## 2015-04-25 DIAGNOSIS — D509 Iron deficiency anemia, unspecified: Secondary | ICD-10-CM | POA: Diagnosis not present

## 2015-04-25 DIAGNOSIS — E039 Hypothyroidism, unspecified: Secondary | ICD-10-CM | POA: Diagnosis not present

## 2015-04-25 DIAGNOSIS — I509 Heart failure, unspecified: Secondary | ICD-10-CM | POA: Insufficient documentation

## 2015-04-25 DIAGNOSIS — I701 Atherosclerosis of renal artery: Secondary | ICD-10-CM | POA: Diagnosis not present

## 2015-04-25 DIAGNOSIS — I6529 Occlusion and stenosis of unspecified carotid artery: Secondary | ICD-10-CM | POA: Diagnosis not present

## 2015-04-25 DIAGNOSIS — Z992 Dependence on renal dialysis: Secondary | ICD-10-CM | POA: Diagnosis not present

## 2015-04-25 DIAGNOSIS — M109 Gout, unspecified: Secondary | ICD-10-CM | POA: Insufficient documentation

## 2015-04-25 DIAGNOSIS — Y832 Surgical operation with anastomosis, bypass or graft as the cause of abnormal reaction of the patient, or of later complication, without mention of misadventure at the time of the procedure: Secondary | ICD-10-CM | POA: Insufficient documentation

## 2015-04-25 HISTORY — DX: Heart failure, unspecified: I50.9

## 2015-04-25 HISTORY — DX: Unspecified atherosclerosis: I70.90

## 2015-04-25 HISTORY — DX: Non-pressure chronic ulcer of other part of unspecified foot with unspecified severity: L97.509

## 2015-04-25 HISTORY — DX: Occlusion and stenosis of unspecified carotid artery: I65.29

## 2015-04-25 HISTORY — DX: Essential (primary) hypertension: I10

## 2015-04-25 HISTORY — PX: PERIPHERAL VASCULAR CATHETERIZATION: SHX172C

## 2015-04-25 HISTORY — DX: Atherosclerosis of renal artery: I70.1

## 2015-04-25 LAB — POTASSIUM (ARMC VASCULAR LAB ONLY): Potassium (ARMC vascular lab): 4.1 (ref 3.5–5.1)

## 2015-04-25 SURGERY — A/V SHUNTOGRAM/FISTULAGRAM
Anesthesia: Moderate Sedation | Laterality: Left

## 2015-04-25 MED ORDER — FENTANYL CITRATE (PF) 100 MCG/2ML IJ SOLN
INTRAMUSCULAR | Status: DC | PRN
  Administered 2015-04-25: 50 ug via INTRAVENOUS

## 2015-04-25 MED ORDER — SODIUM CHLORIDE 0.9 % IV SOLN
INTRAVENOUS | Status: DC
  Administered 2015-04-25: 08:00:00 via INTRAVENOUS

## 2015-04-25 MED ORDER — MIDAZOLAM HCL 5 MG/5ML IJ SOLN
INTRAMUSCULAR | Status: AC
  Filled 2015-04-25: qty 5

## 2015-04-25 MED ORDER — HYDROMORPHONE HCL 1 MG/ML IJ SOLN: 1.0000 mg | Freq: Once | INTRAMUSCULAR | Status: DC

## 2015-04-25 MED ORDER — FENTANYL CITRATE (PF) 100 MCG/2ML IJ SOLN
INTRAMUSCULAR | Status: AC
  Filled 2015-04-25: qty 2

## 2015-04-25 MED ORDER — FAMOTIDINE 20 MG PO TABS: 40.0000 mg | ORAL_TABLET | ORAL | Status: DC | PRN

## 2015-04-25 MED ORDER — LIDOCAINE-EPINEPHRINE (PF) 1 %-1:200000 IJ SOLN
INTRAMUSCULAR | Status: AC
  Filled 2015-04-25: qty 30

## 2015-04-25 MED ORDER — HEPARIN SODIUM (PORCINE) 1000 UNIT/ML IJ SOLN
INTRAMUSCULAR | Status: DC | PRN
  Administered 2015-04-25: 3000 [IU] via INTRAVENOUS

## 2015-04-25 MED ORDER — DEXTROSE 5 % IV SOLN
1.5000 g | INTRAVENOUS | Status: AC
  Administered 2015-04-25: 1.5 g via INTRAVENOUS

## 2015-04-25 MED ORDER — MIDAZOLAM HCL 2 MG/2ML IJ SOLN
INTRAMUSCULAR | Status: DC | PRN
  Administered 2015-04-25: 2 mg via INTRAVENOUS

## 2015-04-25 MED ORDER — IOHEXOL 300 MG/ML  SOLN
INTRAMUSCULAR | Status: DC | PRN
  Administered 2015-04-25: 35 mL

## 2015-04-25 MED ORDER — HEPARIN (PORCINE) IN NACL 2-0.9 UNIT/ML-% IJ SOLN
INTRAMUSCULAR | Status: AC
  Filled 2015-04-25: qty 1000

## 2015-04-25 MED ORDER — ONDANSETRON HCL 4 MG/2ML IJ SOLN: 4.0000 mg | Freq: Four times a day (QID) | INTRAMUSCULAR | Status: DC | PRN

## 2015-04-25 MED ORDER — METHYLPREDNISOLONE SODIUM SUCC 125 MG IJ SOLR: 125.0000 mg | INTRAMUSCULAR | Status: DC | PRN

## 2015-04-25 SURGICAL SUPPLY — 9 items
BALLN ULTRVRSE 9X40X75C (BALLOONS) ×3
BALLOON ULTRVRSE 9X40X75C (BALLOONS) ×1 IMPLANT
CANNULA 5F STIFF (CANNULA) ×3 IMPLANT
DEVICE PRESTO INFLATION (MISCELLANEOUS) ×3 IMPLANT
DRAPE BRACHIAL (DRAPES) ×3 IMPLANT
PACK ANGIOGRAPHY (CUSTOM PROCEDURE TRAY) ×3 IMPLANT
SHEATH BRITE TIP 6FRX5.5 (SHEATH) ×3 IMPLANT
TOWEL OR 17X26 4PK STRL BLUE (TOWEL DISPOSABLE) ×3 IMPLANT
WIRE MAGIC TOR.035 180C (WIRE) ×3 IMPLANT

## 2015-04-25 NOTE — Op Note (Signed)
[facility]    OPERATIVE NOTE   PROCEDURE: 1.   Left brachiocephalic arteriovenous fistula cannulation under ultrasound guidance 2.   Left arm fistulagram including central venogram 3.   Percutaneous transluminal angioplasty of mid upper arm cephalic vein stenosis with 9 mm diameter by 4 cm length angioplasty balloon  PRE-OPERATIVE DIAGNOSIS: 1. ESRD 2. Poorly functional left brachiocephalic AVF  POST-OPERATIVE DIAGNOSIS: same as above   SURGEON: Leotis Pain, MD  ANESTHESIA: local with MCS  ESTIMATED BLOOD LOSS: 25 cc  FINDING(S): 1. Moderate stenosis in the 60-70% range in the mid upper arm cephalic vein. Mild stenosis at the cephalic vein subclavian vein confluence of only about 30%.  SPECIMEN(S):  None  CONTRAST: 35 cc  FLUORO TIME: 0.9 minutes  MODERATE CONSCIOUS SEDATION TIME: Approximately 25 minutes with 2 mg of Versed and 50 mcg of Fentanyl   INDICATIONS: Yvonne Roy is a 82 y.o. female who presents with malfunctioning  left brachiocephalic arteriovenous fistula.  The patient is scheduled for  left arm fistulagram.  The patient is aware the risks include but are not limited to: bleeding, infection, thrombosis of the cannulated access, and possible anaphylactic reaction to the contrast.  The patient is aware of the risks of the procedure and elects to proceed forward.  DESCRIPTION: After full informed written consent was obtained, the patient was brought back to the angiography suite and placed supine upon the angiography table.  The patient was connected to monitoring equipment. Moderate conscious sedation was administered with a face to face encounter with the patient throughout the procedure with my supervision of the RN administering medicines and monitoring the patient's vital signs and mental status throughout from the start of the procedure until the patient was taken to the recovery room. The  left arm was prepped and draped in the standard  fashion for a percutaneous access intervention.  Under ultrasound guidance, the initial portion of the left brachiocephalic arteriovenous fistula was cannulated with a micropuncture needle under direct ultrasound guidance and a permanent image was performed.  The microwire was advanced into the fistula and the needle was exchanged for the a microsheath.  I then upsized to a 6 Fr Sheath and imaging was performed.  Hand injections were completed to image the access including the central venous system. This demonstrated moderate stenosis in the 60-70% range in the mid upper arm cephalic vein. Mild stenosis at the cephalic vein subclavian vein confluence of only about 30%..  Based on the images, this patient will need intervention to the mid upper arm cephalic vein stenosis. I then gave the patient 3000 units of intravenous heparin.  I then crossed the stenosis with a Magic Tourqe wire.  Based on the imaging, a 9 mm x 4 cm  angioplasty balloon was selected.  The balloon was centered around the mid upper arm cephalic vein stenosis and inflated to 10 ATM for 1 minute(s).  On completion imaging, a 10-15 % residual stenosis was present.     Based on the completion imaging, no further intervention is necessary.  The wire and balloon were removed from the sheath.  A 4-0 Monocryl purse-string suture was sewn around the sheath.  The sheath was removed while tying down the suture.  A sterile bandage was applied to the puncture site.  COMPLICATIONS: None  CONDITION: Stable   Yvonne Roy  04/25/2015 10:07 AM

## 2015-04-25 NOTE — H&P (Signed)
[facility] Admission History & Physical  MRN : [id number]  Yvonne Roy is a 82 y.o. (Feb 15, 1923) female who presents with chief complaint of No chief complaint on file. Marland Kitchen  History of Present Illness: Patient is a 82 yo female with ESRD sent from her dialysis access center for poorly functioning access.  No other complaints  No current facility-administered medications for this encounter.    Past Medical History  Diagnosis Date  . Anemia in chronic kidney disease   . Gout   . Iron deficiency anemia   . Vitamin D deficiency   . End stage renal disease (HCC)   . Hypothyroidism   . Hypertension   . CHF (congestive heart failure) (HCC)   . Carotid artery stenosis   . Atherosclerosis   . Ulcer of toe (HCC)   . Renal artery stenosis [facility])     Past Surgical History  Procedure Laterality Date  . Dialysis fistula creation Left   . Abdominal hysterectomy      Social History Social History  Substance Use Topics  . Smoking status: Never Smoker   . Smokeless tobacco: None  . Alcohol Use: No    Family History No history of bleeding disorders, clotting disorders, or autoimmune diseases  Allergies  Allergen Reactions  . Clonidine Derivatives      REVIEW OF SYSTEMS (Negative unless checked)  Constitutional: Weight loss  Fever  Chills Cardiac: Chest pain   Chest pressure   Palpitations   Shortness of breath when laying flat   Shortness of breath at rest   Shortness of breath with exertion. Vascular:  Pain in legs with walking   Pain in legs at rest   Pain in legs when laying flat   Claudication   Pain in feet when walking  Pain in feet at rest  Pain in feet when laying flat   History of DVT   Phlebitis   Swelling in legs   Varicose veins   Non-healing ulcers Pulmonary:   Uses home oxygen   Productive cough   Hemoptysis   Wheeze  COPD   Asthma Neurologic:  Dizziness  Blackouts   Seizures    History of stroke   History of TIA  Aphasia   Temporary blindness   Dysphagia   Weakness or numbness in arms   Weakness or numbness in legs Musculoskeletal:  Arthritis   Joint swelling   Joint pain   Low back pain Hematologic:  Easy bruising  Easy bleeding   Hypercoagulable state   Anemic  Hepatitis Gastrointestinal:  Blood in stool   Vomiting blood  Gastroesophageal reflux/heartburn   Difficulty swallowing. Genitourinary:  Chronic kidney disease   Difficult urination  Frequent urination  Burning with urination   Blood in urine Skin:  Rashes   Ulcers   Wounds Psychological:  History of anxiety    History of major depression.  Physical Examination  Filed Vitals:   04/25/15 0745  BP: 157/76  Pulse: 107  SpO2: 97%   There is no weight on file to calculate BMI. Gen: WD/WN, NAD Head: [place]/AT, No temporalis wasting. Prominent temp pulse not noted. Ear/Nose/Throat: Hearing grossly intact, nares w/o erythema or drainage, oropharynx w/o Erythema/Exudate,  Eyes: PERRLA, EOMI.  Neck: Supple, no nuchal rigidity.  No bruit or JVD.  Pulmonary:  Good air movement, clear to auscultation bilaterally, no use of accessory muscles.  Cardiac: RRR, normal S1, S2, no Murmurs, rubs or gallops. Vascular: bruit and thrill in AVG Vessel  Right Left  Radial Palpable Palpable                                   Gastrointestinal: soft, non-tender/non-distended. No guarding/reflex.  Musculoskeletal: M/S 5/5 throughout.  Extremities without ischemic changes.  No deformity or atrophy.  Neurologic: CN 2-12 intact. Pain and light touch intact in extremities.  Symmetrical.  Speech is fluent. Motor exam as listed above. Psychiatric: Judgment intact, Mood & affect appropriate for pt's clinical situation. Dermatologic: No rashes or ulcers noted.  No cellulitis or open wounds. Lymph : No Cervical, Axillary, or Inguinal lymphadenopathy.    CBC Lab Results   Component Value Date   WBC 6.8 02/12/2015   HGB 10.6* 02/12/2015   HCT 32.7* 02/12/2015   MCV 91.2 02/12/2015   PLT 139* 02/12/2015    BMET    Component Value Date/Time   NA 135 02/12/2015 1221   NA 140 11/28/2013 0442   K 4.2 02/12/2015 1221   K 4.2 11/28/2013 0442   CL 101 02/12/2015 1221   CL 104 11/28/2013 0442   CO2 25 02/12/2015 1221   CO2 24 11/28/2013 0442   GLUCOSE 104* 02/12/2015 1221   GLUCOSE 80 11/28/2013 0442   BUN 56* 02/12/2015 1221   BUN 40* 11/28/2013 0442   CREATININE 5.90* 02/12/2015 1221   CREATININE 3.04* 11/28/2013 0442   CALCIUM 8.7* 02/12/2015 1221   CALCIUM 8.2* 11/28/2013 0442   GFRNONAA 6* 02/12/2015 1221   GFRNONAA 13* 11/28/2013 0442   GFRAA 7* 02/12/2015 1221   GFRAA 15* 11/28/2013 0442   CrCl cannot be calculated (Unknown ideal weight.).  COAG No results found for: INR, PROTIME  Radiology No results found.    Assessment/Plan 1. ESRD. On HD T/Th/S 2. Dysfunction of the dialysis access.  For fistulagram today.   3. HTN. Stable  On outpatient meds   Nollie Terlizzi, MD  04/25/2015 8:03 AM

## 2015-04-25 NOTE — H&P (Signed)
  [facility] History & Physical Update  The patient was interviewed and re-examined.  The patient's previous History and Physical has been reviewed and is unchanged.  There is no change in the plan of care. We plan to proceed with the scheduled procedure.  Sheng Pritz, MD  04/25/2015, 8:01 AM

## 2015-04-25 NOTE — Discharge Instructions (Signed)
Fistulogram, Care After °Refer to this sheet in the next few weeks. These instructions provide you with information on caring for yourself after your procedure. Your health care provider may also give you more specific instructions. Your treatment has been planned according to current medical practices, but problems sometimes occur. Call your health care provider if you have any problems or questions after your procedure. °WHAT TO EXPECT AFTER THE PROCEDURE °After your procedure, it is typical to have the following: °· A small amount of discomfort in the area where the catheters were placed. °· A small amount of bruising around the fistula. °· Sleepiness and fatigue. °HOME CARE INSTRUCTIONS °· Rest at home for the day following your procedure. °· Do not drive or operate heavy machinery while taking pain medicine. °· Take medicines only as directed by your health care provider. °· Do not take baths, swim, or use a hot tub until your health care provider approves. You may shower 24 hours after the procedure or as directed by your health care provider. °· There are many different ways to close and cover an incision, including stitches, skin glue, and adhesive strips. Follow your health care provider's instructions on: °¨ Incision care. °¨ Bandage (dressing) changes and removal. °¨ Incision closure removal. °· Monitor your dialysis fistula carefully. °SEEK MEDICAL CARE IF: °· You have drainage, redness, swelling, or pain at your catheter site. °· You have a fever. °· You have chills. °SEEK IMMEDIATE MEDICAL CARE IF: °· You feel weak. °· You have trouble balancing. °· You have trouble moving your arms or legs. °· You have problems with your speech or vision. °· You can no longer feel a vibration or buzz when you put your fingers over your dialysis fistula. °· The limb that was used for the procedure: °¨ Swells. °¨ Is painful. °¨ Is cold. °¨ Is discolored, such as blue or pale white. °  °This information is not intended  to replace advice given to you by your health care provider. Make sure you discuss any questions you have with your health care provider. °  °Document Released: 07/13/2013 Document Reviewed: 07/13/2013 °Elsevier Interactive Patient Education ©2016 [employer] ° °

## 2015-04-26 ENCOUNTER — Encounter: Payer: Self-pay | Admitting: Vascular Surgery

## 2015-05-03 ENCOUNTER — Encounter: Payer: Self-pay | Admitting: Intensive Care

## 2015-05-03 ENCOUNTER — Emergency Department: Payer: Medicare Other

## 2015-05-03 ENCOUNTER — Inpatient Hospital Stay
Admission: EM | Admit: 2015-05-03 | Discharge: 2015-05-11 | DRG: 871 | Disposition: E | Payer: Medicare Other | Attending: Internal Medicine | Admitting: Internal Medicine

## 2015-05-03 DIAGNOSIS — Z8673 Personal history of transient ischemic attack (TIA), and cerebral infarction without residual deficits: Secondary | ICD-10-CM | POA: Diagnosis not present

## 2015-05-03 DIAGNOSIS — I509 Heart failure, unspecified: Secondary | ICD-10-CM | POA: Diagnosis present

## 2015-05-03 DIAGNOSIS — Z79899 Other long term (current) drug therapy: Secondary | ICD-10-CM | POA: Diagnosis not present

## 2015-05-03 DIAGNOSIS — Z7982 Long term (current) use of aspirin: Secondary | ICD-10-CM | POA: Diagnosis not present

## 2015-05-03 DIAGNOSIS — N2581 Secondary hyperparathyroidism of renal origin: Secondary | ICD-10-CM | POA: Diagnosis present

## 2015-05-03 DIAGNOSIS — J9601 Acute respiratory failure with hypoxia: Secondary | ICD-10-CM | POA: Diagnosis present

## 2015-05-03 DIAGNOSIS — J189 Pneumonia, unspecified organism: Secondary | ICD-10-CM | POA: Diagnosis present

## 2015-05-03 DIAGNOSIS — M109 Gout, unspecified: Secondary | ICD-10-CM | POA: Diagnosis present

## 2015-05-03 DIAGNOSIS — D631 Anemia in chronic kidney disease: Secondary | ICD-10-CM | POA: Diagnosis present

## 2015-05-03 DIAGNOSIS — E039 Hypothyroidism, unspecified: Secondary | ICD-10-CM | POA: Diagnosis present

## 2015-05-03 DIAGNOSIS — Z992 Dependence on renal dialysis: Secondary | ICD-10-CM | POA: Diagnosis not present

## 2015-05-03 DIAGNOSIS — Z90710 Acquired absence of both cervix and uterus: Secondary | ICD-10-CM

## 2015-05-03 DIAGNOSIS — I132 Hypertensive heart and chronic kidney disease with heart failure and with stage 5 chronic kidney disease, or end stage renal disease: Secondary | ICD-10-CM | POA: Diagnosis present

## 2015-05-03 DIAGNOSIS — F0390 Unspecified dementia without behavioral disturbance: Secondary | ICD-10-CM | POA: Diagnosis present

## 2015-05-03 DIAGNOSIS — Z66 Do not resuscitate: Secondary | ICD-10-CM | POA: Diagnosis present

## 2015-05-03 DIAGNOSIS — Z8249 Family history of ischemic heart disease and other diseases of the circulatory system: Secondary | ICD-10-CM | POA: Diagnosis not present

## 2015-05-03 DIAGNOSIS — Z9889 Other specified postprocedural states: Secondary | ICD-10-CM | POA: Diagnosis not present

## 2015-05-03 DIAGNOSIS — Y95 Nosocomial condition: Secondary | ICD-10-CM | POA: Diagnosis present

## 2015-05-03 DIAGNOSIS — N186 End stage renal disease: Secondary | ICD-10-CM | POA: Diagnosis present

## 2015-05-03 DIAGNOSIS — A419 Sepsis, unspecified organism: Secondary | ICD-10-CM | POA: Diagnosis present

## 2015-05-03 DIAGNOSIS — G9341 Metabolic encephalopathy: Secondary | ICD-10-CM | POA: Diagnosis present

## 2015-05-03 HISTORY — DX: Cerebral infarction, unspecified: I63.9

## 2015-05-03 LAB — CBC WITH DIFFERENTIAL/PLATELET
BASOS ABS: 0.1 10*3/uL (ref 0–0.1)
BASOS PCT: 0 %
Eosinophils Absolute: 0.1 10*3/uL (ref 0–0.7)
Eosinophils Relative: 0 %
HEMATOCRIT: 35.5 % (ref 35.0–47.0)
HEMOGLOBIN: 11.1 g/dL — AB (ref 12.0–16.0)
LYMPHS PCT: 8 %
Lymphs Abs: 1.2 10*3/uL (ref 1.0–3.6)
MCH: 31.4 pg (ref 26.0–34.0)
MCHC: 31.3 g/dL — ABNORMAL LOW (ref 32.0–36.0)
MCV: 100.4 fL — AB (ref 80.0–100.0)
MONO ABS: 1.3 10*3/uL — AB (ref 0.2–0.9)
Monocytes Relative: 9 %
NEUTROS ABS: 11.7 10*3/uL — AB (ref 1.4–6.5)
NEUTROS PCT: 83 %
Platelets: 259 10*3/uL (ref 150–440)
RBC: 3.53 MIL/uL — ABNORMAL LOW (ref 3.80–5.20)
RDW: 16.4 % — ABNORMAL HIGH (ref 11.5–14.5)
WBC: 14.3 10*3/uL — ABNORMAL HIGH (ref 3.6–11.0)

## 2015-05-03 LAB — LACTIC ACID, PLASMA
Lactic Acid, Venous: 7 mmol/L (ref 0.5–2.0)
Lactic Acid, Venous: 9.4 mmol/L (ref 0.5–2.0)

## 2015-05-03 LAB — COMPREHENSIVE METABOLIC PANEL
ALBUMIN: 3.3 g/dL — AB (ref 3.5–5.0)
ALK PHOS: 95 U/L (ref 38–126)
ALT: 24 U/L (ref 14–54)
AST: 47 U/L — AB (ref 15–41)
Anion gap: 20 — ABNORMAL HIGH (ref 5–15)
BILIRUBIN TOTAL: 0.6 mg/dL (ref 0.3–1.2)
BUN: 53 mg/dL — AB (ref 6–20)
CALCIUM: 9.2 mg/dL (ref 8.9–10.3)
CO2: 17 mmol/L — ABNORMAL LOW (ref 22–32)
CREATININE: 7.23 mg/dL — AB (ref 0.44–1.00)
Chloride: 102 mmol/L (ref 101–111)
GFR calc Af Amer: 5 mL/min — ABNORMAL LOW (ref >60)
GFR, EST NON AFRICAN AMERICAN: 4 mL/min — AB (ref >60)
GLUCOSE: 230 mg/dL — AB (ref 65–99)
POTASSIUM: 4.1 mmol/L (ref 3.5–5.1)
Sodium: 139 mmol/L (ref 135–145)
TOTAL PROTEIN: 7.3 g/dL (ref 6.5–8.1)

## 2015-05-03 LAB — TROPONIN I: TROPONIN I: 0.15 ng/mL — AB (ref <0.031)

## 2015-05-03 MED ORDER — SODIUM CHLORIDE 0.9 % IV BOLUS (SEPSIS)
500.0000 mL | Freq: Once | INTRAVENOUS | Status: AC
  Administered 2015-05-03: 500 mL via INTRAVENOUS

## 2015-05-03 MED ORDER — SODIUM CHLORIDE 0.9 % IV BOLUS (SEPSIS)
1000.0000 mL | Freq: Once | INTRAVENOUS | Status: AC
  Administered 2015-05-03: 1000 mL via INTRAVENOUS

## 2015-05-03 MED ORDER — METOPROLOL TARTRATE 1 MG/ML IV SOLN
5.0000 mg | Freq: Four times a day (QID) | INTRAVENOUS | Status: DC | PRN
  Filled 2015-05-03: qty 5

## 2015-05-03 MED ORDER — PIPERACILLIN-TAZOBACTAM 3.375 G IVPB
3.3750 g | Freq: Two times a day (BID) | INTRAVENOUS | Status: DC
  Administered 2015-05-03: 3.375 g via INTRAVENOUS
  Filled 2015-05-03 (×3): qty 50

## 2015-05-03 MED ORDER — PIPERACILLIN-TAZOBACTAM 3.375 G IVPB 30 MIN
3.3750 g | Freq: Once | INTRAVENOUS | Status: AC
  Administered 2015-05-03: 3.375 g via INTRAVENOUS
  Filled 2015-05-03: qty 50

## 2015-05-03 MED ORDER — SODIUM CHLORIDE 0.9% FLUSH
3.0000 mL | Freq: Two times a day (BID) | INTRAVENOUS | Status: DC
  Administered 2015-05-03 (×2): 3 mL via INTRAVENOUS

## 2015-05-03 MED ORDER — SODIUM CHLORIDE 0.9 % IV SOLN: 500.0000 mg | INTRAVENOUS | Status: DC

## 2015-05-03 MED ORDER — VANCOMYCIN HCL 10 G IV SOLR
1250.0000 mg | Freq: Once | INTRAVENOUS | Status: AC
  Administered 2015-05-03: 1250 mg via INTRAVENOUS
  Filled 2015-05-03: qty 1250

## 2015-05-03 MED ORDER — HEPARIN SODIUM (PORCINE) 5000 UNIT/ML IJ SOLN
5000.0000 [IU] | Freq: Three times a day (TID) | INTRAMUSCULAR | Status: DC
  Administered 2015-05-03: 5000 [IU] via SUBCUTANEOUS
  Filled 2015-05-03 (×2): qty 1

## 2015-05-03 NOTE — ED Notes (Signed)
MD at bedside.  Admitting MD in room to discuss patient's condition, code status, and plan of care.

## 2015-05-03 NOTE — ED Notes (Signed)
MD at bedside.  Dr. Thedore Mins (Nephrology) in room to assess patient and discuss plan of care with family.

## 2015-05-03 NOTE — ED Notes (Signed)
Patient arrived by EMS from home. Per EMS patient was found unresponsive in bed at home by family.

## 2015-05-03 NOTE — ED Provider Notes (Signed)
CSN: [id number]     Arrival date & time 2015-06-02  0729 History   First MD Initiated Contact with Patient 2015/06/02 [phone]     Chief Complaint  Patient presents with  . Respiratory Distress     (Consider location/radiation/quality/duration/timing/severity/associated sxs/prior Treatment) The history is provided by the EMS personnel and a relative.  Yvonne Roy is a 82 y.o. female hx of anemia, ischemic stroke a year ago on plavix, here with AMS. Patient was found unresponsive in her bed this morning by family. Patient was noted to be lethargic as per EMS and Initial oxygen was 100% on nonrebreather but then desat to 60s to 70s and was bagged en route. Was at baseline yesterday as per family. Denies any coughing, fever, vomiting. Currently on dialysis (Tues, Thur, Sat).   Level V caveat- AMS, dementia      Past Medical History  Diagnosis Date  . Anemia in chronic kidney disease   . Gout   . Iron deficiency anemia   . Vitamin D deficiency   . End stage renal disease (HCC)   . Hypothyroidism   . Hypertension   . CHF (congestive heart failure) (HCC)   . Carotid artery stenosis   . Atherosclerosis   . Ulcer of toe (HCC)   . Renal artery stenosis [facility])    Past Surgical History  Procedure Laterality Date  . Dialysis fistula creation Left   . Abdominal hysterectomy    . Peripheral vascular catheterization Left 04/25/2015    Procedure: A/V Shuntogram/Fistulagram;  Surgeon: Annice Needy, MD;  Location: [facility] INVASIVE CV LAB;  Service: Cardiovascular;  Laterality: Left;  . Peripheral vascular catheterization Left 04/25/2015    Procedure: A/V Shunt Intervention;  Surgeon: Annice Needy, MD;  Location: [facility] INVASIVE CV LAB;  Service: Cardiovascular;  Laterality: Left;   History reviewed. No pertinent family history. Social History  Substance Use Topics  . Smoking status: Never Smoker   . Smokeless tobacco: None  . Alcohol Use: No   OB History    No data available     Review of Systems   Unable to perform ROS: Mental status change  All other systems reviewed and are negative.     Allergies  Review of patient's allergies indicates no known allergies.  Home Medications   Prior to Admission medications   Medication Sig Start Date End Date Taking? Authorizing Provider  allopurinol (ZYLOPRIM) 100 MG tablet Take 1 tablet by mouth daily. 01/25/15  Yes Historical Provider, MD  aspirin 325 MG tablet Take 325 mg by mouth daily.   Yes Historical Provider, MD  atorvastatin (LIPITOR) 20 MG tablet Take 1 tablet (20 mg total) by mouth daily. Patient taking differently: Take 20 mg by mouth daily at 6 PM.  02/11/15  Yes Sital Mody, MD  cetirizine (ZYRTEC) 10 MG tablet Take 1 tablet by mouth daily. 01/18/15  Yes Historical Provider, MD  clopidogrel (PLAVIX) 75 MG tablet Take 75 mg by mouth daily. 01/18/15  Yes Historical Provider, MD  losartan (COZAAR) 50 MG tablet Take 50 mg by mouth daily.   Yes Historical Provider, MD  ranitidine (ZANTAC) 150 MG tablet Take 150 mg by mouth at bedtime.  01/28/15  Yes Historical Provider, MD  SYNTHROID 100 MCG tablet Take 100 mcg by mouth daily. 01/10/15  Yes Historical Provider, MD   BP 135/84 mmHg  Pulse 118  Temp(Src) 95.5 F (35.3 C) (Rectal)  Resp 16  Wt 125 lb (56.7 kg)  SpO2 100% Physical Exam  Constitutional:  Lethargic, responsive only to painful stimuli   HENT:  Head: Normocephalic.  Eyes:  Eye deviation to the Right, pupils sluggish but equal   Neck: Normal range of motion. Neck supple.  Cardiovascular: Normal rate, regular rhythm and normal heart sounds.   Pulmonary/Chest:  Diminished bilaterally   Abdominal: Soft. Bowel sounds are normal. She exhibits no distension. There is no tenderness. There is no rebound.  Musculoskeletal: Normal range of motion. She exhibits no edema or tenderness.  Neurological:  Lethargic, withdrawals to painful stimuli bilaterally   Skin: Skin is warm and dry.  Psychiatric:  Unable   Nursing note  and vitals reviewed.   ED Course  Procedures (including critical care time)  CRITICAL CARE Performed by: Silverio Lay, DAVID   Total critical care time: 30 minutes  Critical care time was exclusive of separately billable procedures and treating other patients.  Critical care was necessary to treat or prevent imminent or life-threatening deterioration.  Critical care was time spent personally by me on the following activities: development of treatment plan with patient and/or surrogate as well as nursing, discussions with consultants, evaluation of patient's response to treatment, examination of patient, obtaining history from patient or surrogate, ordering and performing treatments and interventions, ordering and review of laboratory studies, ordering and review of radiographic studies, pulse oximetry and re-evaluation of patient's condition.   Labs Review Labs Reviewed  CBC WITH DIFFERENTIAL/PLATELET - Abnormal; Notable for the following:    WBC 14.3 (*)    RBC 3.53 (*)    Hemoglobin 11.1 (*)    MCV 100.4 (*)    MCHC 31.3 (*)    RDW 16.4 (*)    Neutro Abs 11.7 (*)    Monocytes Absolute 1.3 (*)    All other components within normal limits  COMPREHENSIVE METABOLIC PANEL - Abnormal; Notable for the following:    CO2 17 (*)    Glucose, Bld 230 (*)    BUN 53 (*)    Creatinine, Ser 7.23 (*)    Albumin 3.3 (*)    AST 47 (*)    GFR calc non Af Amer 4 (*)    GFR calc Af Amer 5 (*)    Anion gap 20 (*)    All other components within normal limits  TROPONIN I - Abnormal; Notable for the following:    Troponin I 0.15 (*)    All other components within normal limits  LACTIC ACID, PLASMA - Abnormal; Notable for the following:    Lactic Acid, Venous 7.0 (*)    All other components within normal limits  CULTURE, BLOOD (ROUTINE X 2)  CULTURE, BLOOD (ROUTINE X 2)    Imaging Review Ct Head Wo Contrast  05-19-2015  CLINICAL DATA:  Found unresponsive EXAM: CT HEAD WITHOUT CONTRAST TECHNIQUE:  Contiguous axial images were obtained from the base of the skull through the vertex without intravenous contrast. COMPARISON:  02/10/2015 FINDINGS: Old right frontoparietal infarct and left posterior parietal infarct. Old lacunar infarcts in the left basal ganglia. There is atrophy and chronic small vessel disease changes. No acute intracranial abnormality. Specifically, no hemorrhage, hydrocephalus, mass lesion, acute infarction, or significant intracranial injury. No acute calvarial abnormality. Visualized paranasal sinuses and mastoids clear. Orbital soft tissues unremarkable. IMPRESSION: Old infarcts as above. No acute intracranial abnormality. Atrophy, chronic microvascular disease. Electronically Signed   By: Charlett Nose M.D.   On: 05-19-15 08:03   Dg Chest Port 1 View  19-May-2015  CLINICAL DATA:  Shortness of breath, altered mental  status EXAM: PORTABLE CHEST 1 VIEW COMPARISON:  02/10/2015 and 11/24/2013 FINDINGS: Cardiomegaly again noted. Central mild vascular congestion and mild bilateral interstitial prominence without convincing pulmonary edema. No segmental infiltrate. Atherosclerotic calcifications of thoracic aorta again noted. There is poor inspiration with bilateral basilar atelectasis left greater than right. IMPRESSION: Limited study by poor inspiration. Bilateral basilar atelectasis left greater than right. Central vascular congestion and mild interstitial prominence bilateral without convincing pulmonary edema. Atherosclerotic calcifications of thoracic aorta again noted. Electronically Signed   By: Natasha Mead M.D.   On: 05/19/2015 08:08   I have personally reviewed and evaluated these images and lab results as part of my medical decision-making.   EKG Interpretation None      ED ECG REPORT I, YAO, DAVID, the attending physician, personally viewed and interpreted this ECG.   Date: 19-May-2015  EKG Time: 7:36 am  Rate: 103  Rhythm: atrial fibrillation, rate 103  Axis: normal   Intervals:left anterior fascicular block  ST&T Change: none  MDM   Final diagnoses:  None   Yvonne Roy is a 82 y.o. female here with AMS. Concerned for possible stroke causing lethargy, eye deviation. Also consider aspiration. Will do sepsis workup and CT head. Patient full code but has poor prognosis, will discuss goals of care with family.   8:51 AM CT head showed no bleed. WBC 14. Lactate 7. CXR showed possible pneumonia. Had extensive discussion with 2 daughters. They want her comfortable as much as possible. Patient DNR. Given IV abx. Will admit.       Richardean Canal, MD 05/19/15 [phone]

## 2015-05-03 NOTE — Progress Notes (Signed)
Subjective:  Patient presents from home for altered mental status and Fever Last night, she was in her usual state of health This AM, her daughter could not wake her up and noted "froth" around her mouth She was brought to ER for further evaluation   Objective:  Vital signs in last 24 hours:  Temp:  [95.5 F (35.3 C)-97.2 F (36.2 C)] 97.2 F (36.2 C) (02/21 1141) Pulse Rate:  [106-142] 142 (02/21 1330) Resp:  [0-26] 0 (02/21 1030) BP: (114-168)/(69-135) 166/116 mmHg (02/21 1330) SpO2:  [95 %-100 %] 100 % (02/21 1330) Weight:  [56.7 kg (125 lb)] 56.7 kg (125 lb) (02/21 0734)  Weight change:  Filed Weights   05/09/15 0734  Weight: 56.7 kg (125 lb)    Intake/Output:   No intake or output data in the 24 hours ending 05/09/2015 1426   Physical Exam: General: Thin, frail, critically ill appearing,   HEENT Temporal waiting, NRB  Neck supple  Pulm/lungs Coarse breath sounds b/l, tachypneic  CVS/Heart Tachycardic, irregular  Abdomen:  Soft, NT  Extremities: No edema  Neurologic: Did not wake up to verbal stimuli  Skin: No rashes  Access: Warm, dry       Basic Metabolic Panel:   Recent Labs Lab 2015/05/09 0736  NA 139  K 4.1  CL 102  CO2 17*  GLUCOSE 230*  BUN 53*  CREATININE 7.23*  CALCIUM 9.2     CBC:  Recent Labs Lab May 09, 2015 0736  WBC 14.3*  NEUTROABS 11.7*  HGB 11.1*  HCT 35.5  MCV 100.4*  PLT 259      Microbiology:  No results found for this or any previous visit (from the past 720 hour(s)).  Coagulation Studies: No results for input(s): LABPROT, INR in the last 72 hours.  Urinalysis: No results for input(s): COLORURINE, LABSPEC, PHURINE, GLUCOSEU, HGBUR, BILIRUBINUR, KETONESUR, PROTEINUR, UROBILINOGEN, NITRITE, LEUKOCYTESUR in the last 72 hours.  Invalid input(s): APPERANCEUR    Imaging: Ct Head Wo Contrast  05/09/2015  CLINICAL DATA:  Found unresponsive EXAM: CT HEAD WITHOUT CONTRAST TECHNIQUE: Contiguous axial images were  obtained from the base of the skull through the vertex without intravenous contrast. COMPARISON:  02/10/2015 FINDINGS: Old right frontoparietal infarct and left posterior parietal infarct. Old lacunar infarcts in the left basal ganglia. There is atrophy and chronic small vessel disease changes. No acute intracranial abnormality. Specifically, no hemorrhage, hydrocephalus, mass lesion, acute infarction, or significant intracranial injury. No acute calvarial abnormality. Visualized paranasal sinuses and mastoids clear. Orbital soft tissues unremarkable. IMPRESSION: Old infarcts as above. No acute intracranial abnormality. Atrophy, chronic microvascular disease. Electronically Signed   By: Charlett Nose M.D.   On: 2015/05/09 08:03   Dg Chest Port 1 View  05-09-2015  CLINICAL DATA:  Shortness of breath, altered mental status EXAM: PORTABLE CHEST 1 VIEW COMPARISON:  02/10/2015 and 11/24/2013 FINDINGS: Cardiomegaly again noted. Central mild vascular congestion and mild bilateral interstitial prominence without convincing pulmonary edema. No segmental infiltrate. Atherosclerotic calcifications of thoracic aorta again noted. There is poor inspiration with bilateral basilar atelectasis left greater than right. IMPRESSION: Limited study by poor inspiration. Bilateral basilar atelectasis left greater than right. Central vascular congestion and mild interstitial prominence bilateral without convincing pulmonary edema. Atherosclerotic calcifications of thoracic aorta again noted. Electronically Signed   By: Natasha Mead M.D.   On: 05/09/2015 08:08     Medications:   . piperacillin-tazobactam (ZOSYN)  IV     . [START ON 05/05/2015] vancomycin  500 mg Intravenous Q T,Th,Sa-HD  metoprolol  Assessment/ Plan:  82 y.o. female  with pmhx of ESRD on HD, anemia of CKD, SHPTH, malignant hypertension, hx of CVA. LUE AVF, hypothyroidism, gout, h/o Left parietal and high occipital.  HD [facility] [facility]  1.  End-stage renal disease on hemodialysis TTHS:  - Electrolytes and Volume status are acceptable No acute indication for Dialysis at present   2. Anemia of chronic kidney disease.  Hgb 11.1   3. Secondary hyperparathyroidism.  - monitor phos closely  4. Acute respiratory failure - Likely aspiration pneumonia - family confirms DNR - broad spectrum Abx per IM team  5. Altered mental status/confusion/  - likely secondary to concurrent infection   LOS: 0 Yvonne Roy 2/21/20172:26 PM

## 2015-05-03 NOTE — H&P (Signed)
[facility] - [place] at [facility]   PATIENT NAME: Yvonne Roy    MR#:  [id number]  DATE OF BIRTH:  [birth year]  DATE OF ADMISSION:  05/01/2015  PRIMARY CARE PHYSICIAN: Leotis Shames, MD   REQUESTING/REFERRING PHYSICIAN: Dr. Manson Passey  CHIEF COMPLAINT:   Chief Complaint  Patient presents with  . Respiratory Distress    HISTORY OF PRESENT ILLNESS: Yvonne Roy  is a 82 y.o. female with a known history of anemia of chronic disease, and deficiency anemia, end-stage renal disease on hemodialysis on Tuesday Thursday Saturday, hypothyroidism, hypertension, congestive heart failure, carotid artery stenosis, stroke - the last one was in December 2016, after that she was able to walk with a walker and eat regular food on her own, have some baseline dementia and lives with family. As per her daughter who lives with her, patient was completely at her baseline last evening she ate 13 around 5 or 6 and she was doing fine up until she went to sleep. This morning she tried to wake her up and patient is not responding she is not opening her eyes or talking to her so she called ambulance and she was brought over here. She denies of patient having any cough, fever, chills, any sick contacts at home in last few days. In ER she was noted to have elevated white cell count, tachycardia, tachypnea, hypoxia, hypothermia, infiltrate on chest x-ray, negative CT scan of the head. She started on warm air blanket and nonrebreather mask, and broad-spectrum antibiotic- and given to hospitalist team for admission for sepsis.  PAST MEDICAL HISTORY:   Past Medical History  Diagnosis Date  . Anemia in chronic kidney disease   . Gout   . Iron deficiency anemia   . Vitamin D deficiency   . End stage renal disease (HCC)   . Hypothyroidism   . Hypertension   . CHF (congestive heart failure) (HCC)   . Carotid artery stenosis   . Atherosclerosis   . Ulcer of toe (HCC)   . Renal artery stenosis (HCC)    . Stroke [facility])     PAST SURGICAL HISTORY:  Past Surgical History  Procedure Laterality Date  . Dialysis fistula creation Left   . Abdominal hysterectomy    . Peripheral vascular catheterization Left 04/25/2015    Procedure: A/V Shuntogram/Fistulagram;  Surgeon: Annice Needy, MD;  Location: [facility] INVASIVE CV LAB;  Service: Cardiovascular;  Laterality: Left;  . Peripheral vascular catheterization Left 04/25/2015    Procedure: A/V Shunt Intervention;  Surgeon: Annice Needy, MD;  Location: [facility] INVASIVE CV LAB;  Service: Cardiovascular;  Laterality: Left;    SOCIAL HISTORY:  Social History  Substance Use Topics  . Smoking status: Never Smoker   . Smokeless tobacco: Not on file  . Alcohol Use: No    FAMILY HISTORY:  Family History  Problem Relation Age of Onset  . Hypertension Mother     DRUG ALLERGIES: No Known Allergies  REVIEW OF SYSTEMS:   Patient is not able to give a review of system because of altered mental status.  MEDICATIONS AT HOME:  Prior to Admission medications   Medication Sig Start Date End Date Taking? Authorizing Provider  allopurinol (ZYLOPRIM) 100 MG tablet Take 1 tablet by mouth daily. 01/25/15  Yes Historical Provider, MD  aspirin 325 MG tablet Take 325 mg by mouth daily.   Yes Historical Provider, MD  atorvastatin (LIPITOR) 20 MG tablet Take 1 tablet (20 mg total) by mouth daily. Patient taking  differently: Take 20 mg by mouth daily at 6 PM.  02/11/15  Yes Sital Mody, MD  cetirizine (ZYRTEC) 10 MG tablet Take 1 tablet by mouth daily. 01/18/15  Yes Historical Provider, MD  clopidogrel (PLAVIX) 75 MG tablet Take 75 mg by mouth daily. 01/18/15  Yes Historical Provider, MD  losartan (COZAAR) 50 MG tablet Take 50 mg by mouth daily.   Yes Historical Provider, MD  ranitidine (ZANTAC) 150 MG tablet Take 150 mg by mouth at bedtime.  01/28/15  Yes Historical Provider, MD  SYNTHROID 100 MCG tablet Take 100 mcg by mouth daily. 01/10/15  Yes Historical Provider, MD       PHYSICAL EXAMINATION:   VITAL SIGNS: Blood pressure 131/93, pulse 106, temperature 95.5 F (35.3 C), temperature source Rectal, resp. rate 0, weight 56.7 kg (125 lb), SpO2 100 %.  GENERAL:  82 y.o.-year-old patient lying in the bed with acute respiratory distress.  EYES: Pupils equal, round, reactive to light , both eyes deviated towards her right side. No scleral icterus. Extraocular muscles could not be checked because patient is not following, once. HEENT: Head atraumatic, normocephalic. Oropharynx and nasopharynx clear.  NECK:  Supple, no jugular venous distention. No thyroid enlargement, no tenderness.  LUNGS: Normal breath sounds bilaterally, no wheezing, some crepitation. Positive use of accessory muscles of respiration. Using non-rebreather mask. CARDIOVASCULAR: S1, S2 normal, tachycardia. No murmurs.  ABDOMEN: Soft, nontender, nondistended. Bowel sounds present. No organomegaly or mass.  EXTREMITIES: No pedal edema, cyanosis, or clubbing.  NEUROLOGIC: Cranial nerves could not be checked as patient appears altered mental status and not following commands but I movements as mentioned above. She is not able to squeeze my arms and her both upper limb feels floppy without any muscle tone, both legs she has been afebrile for withdrawal or jerky type of movement on stimulus. PSYCHIATRIC: The patient is altered mental status.  SKIN: No obvious rash, lesion, or ulcer.   LABORATORY PANEL:   CBC  Recent Labs Lab 05/07/2015 0736  WBC 14.3*  HGB 11.1*  HCT 35.5  PLT 259  MCV 100.4*  MCH 31.4  MCHC 31.3*  RDW 16.4*  LYMPHSABS 1.2  MONOABS 1.3*  EOSABS 0.1  BASOSABS 0.1   ------------------------------------------------------------------------------------------------------------------  Chemistries   Recent Labs Lab 05/05/2015 0736  NA 139  K 4.1  CL 102  CO2 17*  GLUCOSE 230*  BUN 53*  CREATININE 7.23*  CALCIUM 9.2  AST 47*  ALT 24  ALKPHOS 95  BILITOT 0.6    ------------------------------------------------------------------------------------------------------------------ estimated creatinine clearance is 4.1 mL/min (by C-G formula based on Cr of 7.23). ------------------------------------------------------------------------------------------------------------------ No results for input(s): TSH, T4TOTAL, T3FREE, THYROIDAB in the last 72 hours.  Invalid input(s): FREET3   Coagulation profile No results for input(s): INR, PROTIME in the last 168 hours. ------------------------------------------------------------------------------------------------------------------- No results for input(s): DDIMER in the last 72 hours. -------------------------------------------------------------------------------------------------------------------  Cardiac Enzymes  Recent Labs Lab 05/06/2015 0736  TROPONINI 0.15*   ------------------------------------------------------------------------------------------------------------------ Invalid input(s): POCBNP  ---------------------------------------------------------------------------------------------------------------  Urinalysis    Component Value Date/Time   COLORURINE Yellow 11/25/2013 1345   APPEARANCEUR Hazy 11/25/2013 1345   LABSPEC 1.010 11/25/2013 1345   PHURINE 5.0 11/25/2013 1345   GLUCOSEU Negative 11/25/2013 1345   HGBUR Negative 11/25/2013 1345   BILIRUBINUR Negative 11/25/2013 1345   KETONESUR Negative 11/25/2013 1345   PROTEINUR 30 mg/dL 09/81/1914 7829   NITRITE Negative 11/25/2013 1345   LEUKOCYTESUR 1+ 11/25/2013 1345     RADIOLOGY: Ct Head Wo Contrast  04/14/2015  CLINICAL DATA:  Found unresponsive EXAM: CT HEAD WITHOUT CONTRAST TECHNIQUE: Contiguous axial images were obtained from the base of the skull through the vertex without intravenous contrast. COMPARISON:  02/10/2015 FINDINGS: Old right frontoparietal infarct and left posterior parietal infarct. Old lacunar infarcts in  the left basal ganglia. There is atrophy and chronic small vessel disease changes. No acute intracranial abnormality. Specifically, no hemorrhage, hydrocephalus, mass lesion, acute infarction, or significant intracranial injury. No acute calvarial abnormality. Visualized paranasal sinuses and mastoids clear. Orbital soft tissues unremarkable. IMPRESSION: Old infarcts as above. No acute intracranial abnormality. Atrophy, chronic microvascular disease. Electronically Signed   By: Charlett Nose M.D.   On: 07-May-2015 08:03   Dg Chest Port 1 View  May 07, 2015  CLINICAL DATA:  Shortness of breath, altered mental status EXAM: PORTABLE CHEST 1 VIEW COMPARISON:  02/10/2015 and 11/24/2013 FINDINGS: Cardiomegaly again noted. Central mild vascular congestion and mild bilateral interstitial prominence without convincing pulmonary edema. No segmental infiltrate. Atherosclerotic calcifications of thoracic aorta again noted. There is poor inspiration with bilateral basilar atelectasis left greater than right. IMPRESSION: Limited study by poor inspiration. Bilateral basilar atelectasis left greater than right. Central vascular congestion and mild interstitial prominence bilateral without convincing pulmonary edema. Atherosclerotic calcifications of thoracic aorta again noted. Electronically Signed   By: Natasha Mead M.D.   On: May 07, 2015 08:08    IMPRESSION AND PLAN:  * Sepsis with acute hypoxic respiratory failure and altered mental status  Secondary to healthcare associated pneumonia  Possibly aspiration may be the underlying reason.  Patient is on hemodialysis.  We'll give her IV vancomycin and Zosyn, cultures are sent by ER.  Patient is also on [place].     I discussed with family, where her 3 daughters and granddaughter and a few other members, at the time of admission present in the room.   I informed them about her critical condition and possibility of she may not survive this pneumonia episode.   She has DO  NOT RESUSCITATE directive, and they understand the seriousness of the situation, and would just like to give a trial of continuing oxygen in giving antibiotics to see if she survives this episode.   They would not like her to go on the ventilator, will receive any aggressive management further.   In that case, we can keep her on the medical floor as we will not be providing any additional aggressive care in ICU anyways. They understand this and agree for that.   I will also call palliative care consult.   * Altered mental status  Likely metabolic encephalopathy from the sepsis, although it may be a stroke episode.  CT scan of the head is negative.   I spoke to family and gave them option for doing the stroke workup including MRI, carotid Doppler study, echocardiogram, speech and swallow evaluation, if any of them is possible in this condition; OR to wait, to see if she survives this pneumonia episode and sepsis.   They are very reasonable and understand the situation, and elevated on a waiting for a day or 2 to see her improvement from pneumonia, if she survives the sepsis and pneumonia then we will do the stroke workup to help for the discharge planning.   Meanwhile will keep her nothing by mouth as she may be high risk for aspiration.  * Previous stroke  Currently hold oral medications because of sepsis and altered mental status.  * End-stage renal disease on hemodialysis.  I'll call nephrology consult, today's her regular dialysis day,  nephrology to decide whether she is stable enough and if she needs dialysis today or not.  * Hypertension  Blood pressure is stable, but patient presented with sepsis so I will hold oral antihypertensive medications at this point and continue monitoring.    All the records are reviewed and case discussed with ED provider. Management plans discussed with the patient, family and they are in agreement.  CODE STATUS: DO NOT RESUSCITATE  Confirmed with her  multiple family members were present in the room at the time of admission. Code Status History    Date Active Date Inactive Code Status Order ID Comments User Context   02/10/2015  6:06 PM 02/12/2015  9:08 PM Full Code [id number]  Katharina Caper, MD Inpatient       TOTAL TIME TAKING CARE OF THIS PATIENT:  60 critical care minutes.  The patient's critical condition, and possible plans and options discuss with her family members were present in the room and they all agree on proposed plan as mentioned above.  Altamese Dilling M.D on 06-02-2015   Between 7am to 6pm - Pager - [phone]  After 6pm go to [email or web address] - password [email or web address]  [facility]  Office  [phone]  CC: Primary care physician; Leotis Shames, MD   Note: This dictation was prepared with Dragon dictation along with smaller phrase technology. Any transcriptional errors that result from this process are unintentional.

## 2015-05-03 NOTE — Progress Notes (Signed)
Pharmacy Antibiotic Note  Yvonne Roy is a 82 y.o. female Hemodialysis patient admitted on 05/08/2015 with sepsis.  Pharmacy has been consulted for Vancomycin and Zosyn dosing.  Plan: Vancomycin 1250 mg Load today followed by  Vancomycin  after each dialysis session. (T,Th,Sa) Zosyn 3.375mg  every 12 hours  Weight: 125 lb (56.7 kg)  Temp (24hrs), Avg:95.5 F (35.3 C), Min:95.5 F (35.3 C), Max:95.5 F (35.3 C)   Recent Labs Lab 05/06/2015 0736  WBC 14.3*  CREATININE 7.23*  LATICACIDVEN 7.0*    Estimated Creatinine Clearance: 4.1 mL/min (by C-G formula based on Cr of 7.23).    No Known Allergies  Antimicrobials this admission: Vancomycin 2/21 >>   Zosyn 2/21 >>   Dose adjustments this admission:   Microbiology results:      Thank you for allowing pharmacy to be a part of this patient's care.  Shelby Anderle K 04/20/2015 10:26 AM

## 2015-05-03 NOTE — Progress Notes (Signed)
   04/25/2015 0800  Clinical Encounter Type  Visited With Family  Visit Type ED  Referral From Nurse  Consult/Referral To Chaplain  Spiritual Encounters  Spiritual Needs Prayer  Stress Factors  Patient Stress Factors Health changes  Family Stress Factors Health changes  Met w/daughters to discuss mother's possible wishes regarding resuscitation options in the event of heart failure. Daughters continued to discuss, but requested prayer for wisdom. We prayed as they prepared for their mother to return from [place] and arrival of third daughter who was enroute.  Chap. Yianna Tersigni G. Kismet

## 2015-05-08 LAB — CULTURE, BLOOD (ROUTINE X 2)
CULTURE: NO GROWTH
Culture: NO GROWTH

## 2015-05-11 NOTE — Progress Notes (Addendum)
Patient seen and evaluated. No pulse felt.Pupils fixed and non reactive.No respirations noted. Patient expired at 3.55 am on May 09, 2015.DNR by code status. Family at bedside and informed regarding the death of patient RN taking care of the patient informed.

## 2015-05-11 DEATH — deceased

## 2015-06-11 NOTE — Discharge Summary (Signed)
Date of death- May 04, 2015.  Cause of death- health-care associated pneumonia  Hospital course and stay    Patient came with altered mental status, hypothermia, sepsis- found to have pneumonia- the family agreed with treatment with antibiotics but no CPR. She was admitted to ICU for broad-spectrum antibiotics, nephrology consult was also called in for hemodialysis.  Previous stroke- she was just monitored in ICU for this.    The patient died in ICU less than 24 hours of admission.   For further details please see history and physical done on admission.

## 2016-07-09 IMAGING — CT CT HEAD W/O CM
2 series · 15 of 30 positions shown, 19 images · non-contrast
Comparison: 02/10/2015

CLINICAL DATA: Found unresponsive

EXAM:
CT HEAD WITHOUT CONTRAST
TECHNIQUE: Contiguous axial images were obtained from the base of the skull
through the vertex without intravenous contrast.

[Series 2: soft tissue · axial · 0.42mm/px · z∈[-111,+19]mm · 14 of 31 slices shown, 18 images (1 of 2)]
[im 3/31  brain]
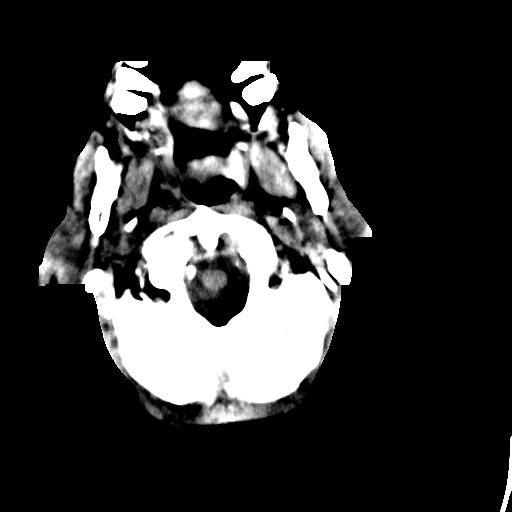
[im 3/31  bone]
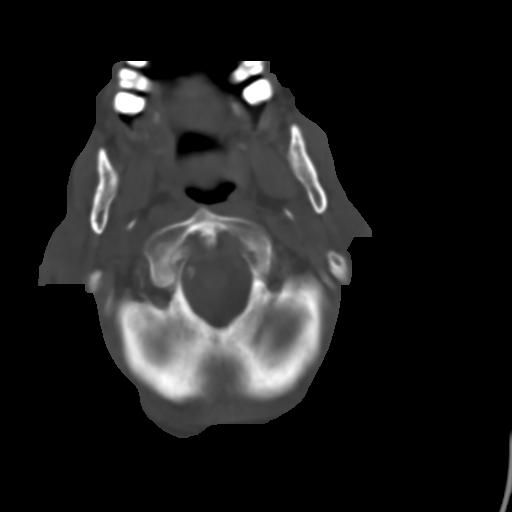
[im 5/31  brain]
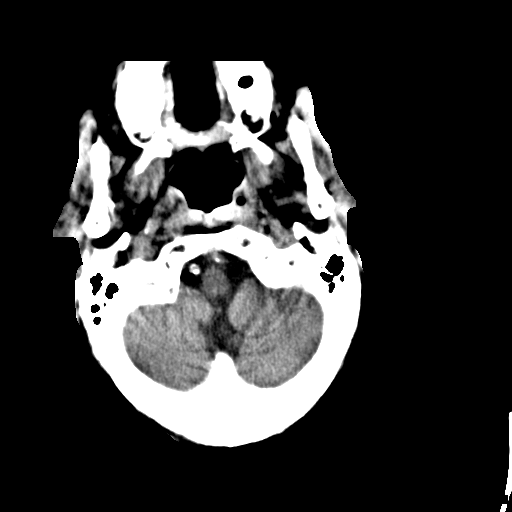
[im 7/31  brain]
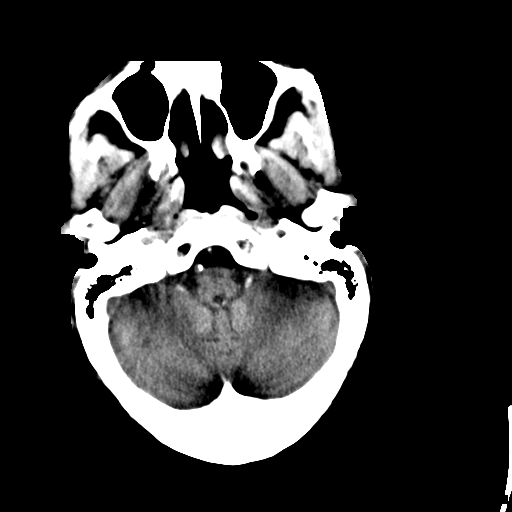
[im 9/31  brain]
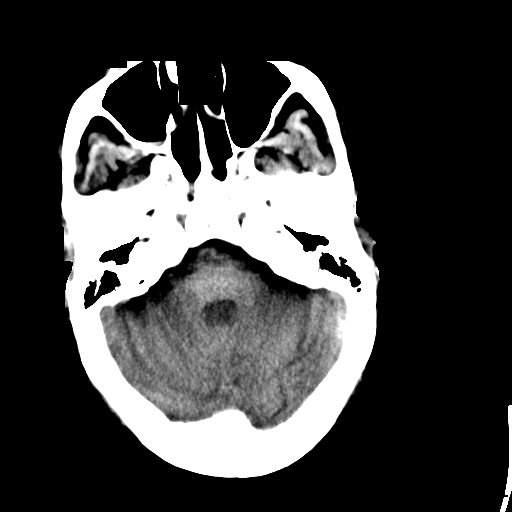
[im 11/31  brain]
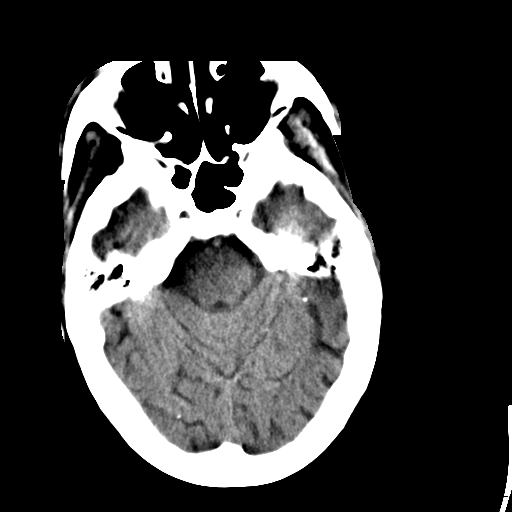
[im 11/31  bone]
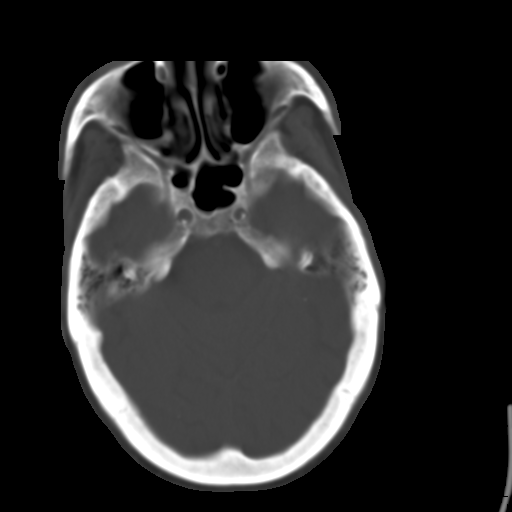
[im 13/31  brain]
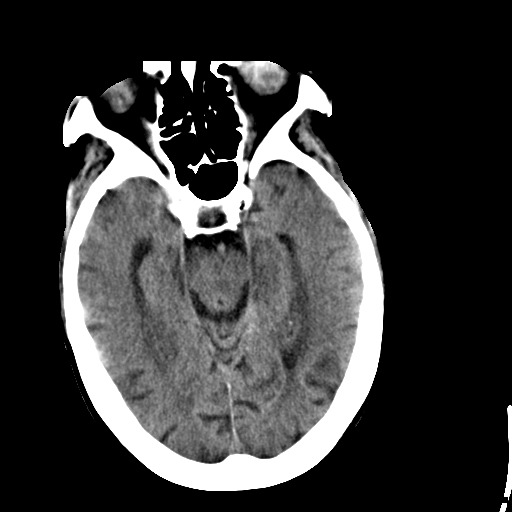
[im 15/31  brain]
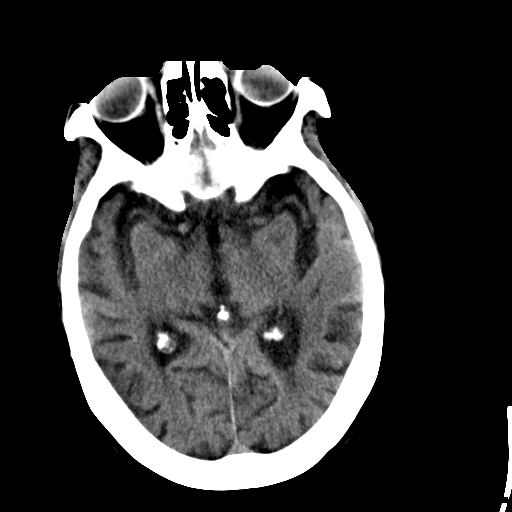
[im 17/31  brain]
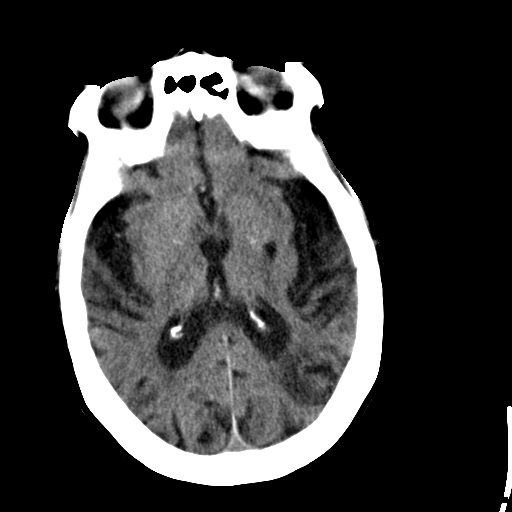
[im 19/31  brain]
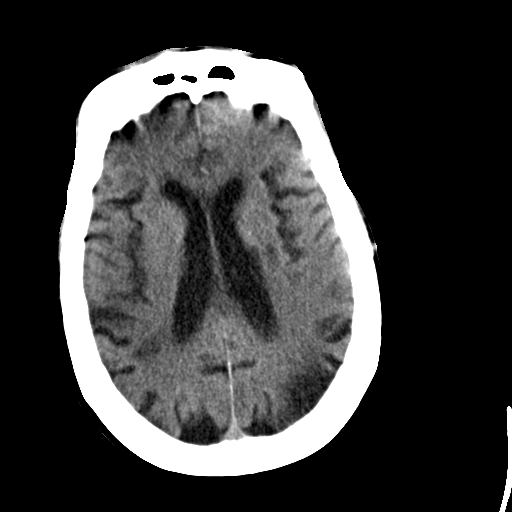
[im 19/31  bone]
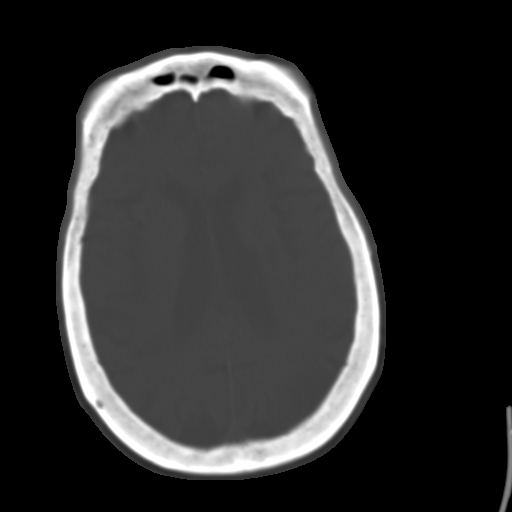
[im 21/31  brain]
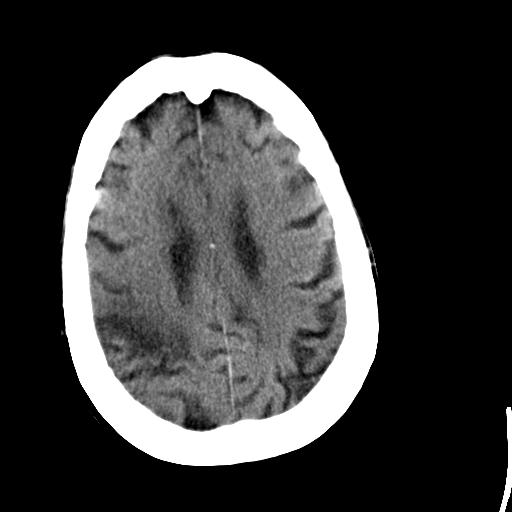
[im 23/31  brain]
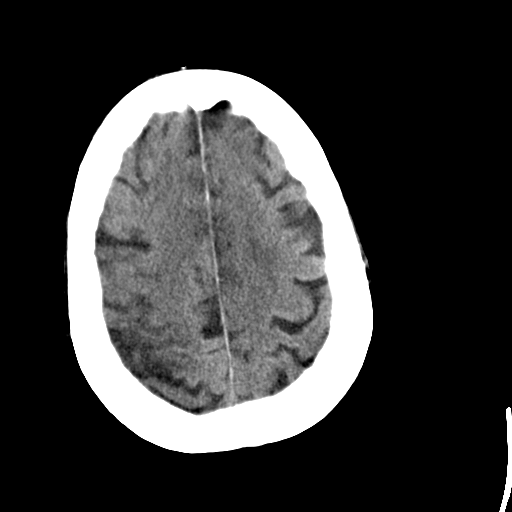
[im 25/31  brain]
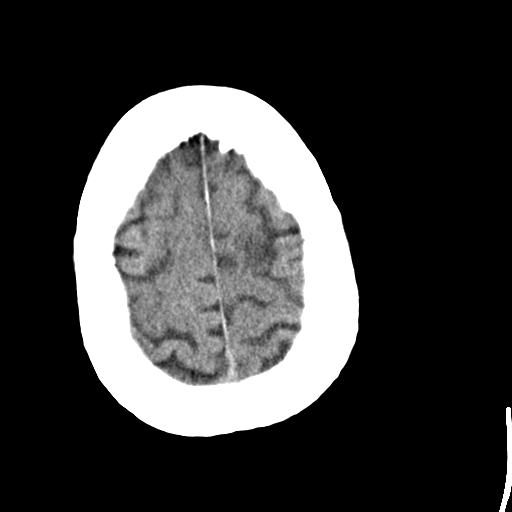
[im 27/31  brain]
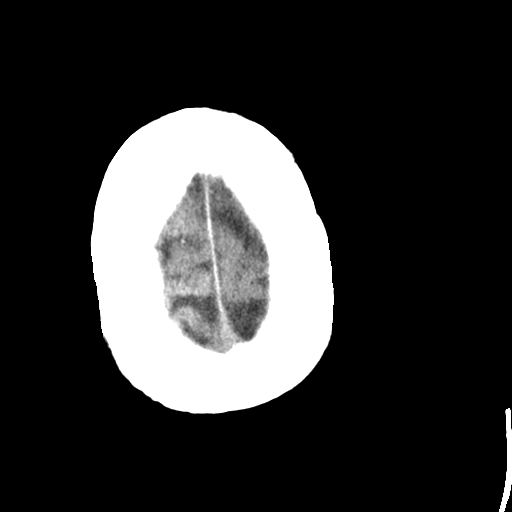
[im 27/31  bone]
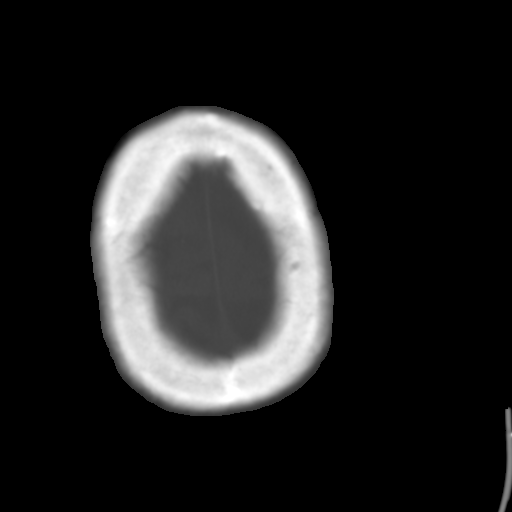
[im 29/31  brain]
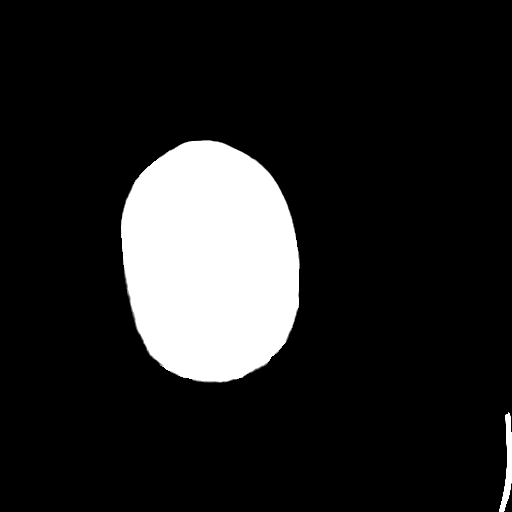

[Series 4: soft tissue · axial · 0.34mm/px · 1 of 28 slices shown (2 of 2)]
[im 3/28  brain]
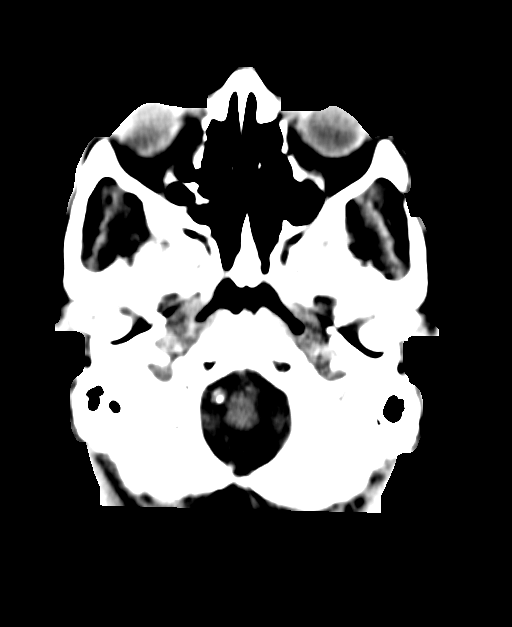

[15 of 30 positions shown; findings below may reference images not displayed]

FINDINGS: Old right frontoparietal infarct and left posterior parietal
infarct. Old lacunar infarcts in the left basal ganglia. There is
atrophy and chronic small vessel disease changes. No acute
intracranial abnormality. Specifically, no hemorrhage,
hydrocephalus, mass lesion, acute infarction, or significant
intracranial injury. No acute calvarial abnormality. Visualized
paranasal sinuses and mastoids clear. Orbital soft tissues
unremarkable.
IMPRESSION: Old infarcts as above.

No acute intracranial abnormality.

Atrophy, chronic microvascular disease.
